# Patient Record
Sex: Female | Born: 1980 | Race: Black or African American | Hispanic: No | Marital: Single | Smoking: Never smoker
Health system: Southern US, Community
[De-identification: ages and names within clinical notes are randomized; demographics above are authoritative.]

## PROBLEM LIST (undated history)

## (undated) DIAGNOSIS — T7840XA Allergy, unspecified, initial encounter: Secondary | ICD-10-CM

## (undated) DIAGNOSIS — E049 Nontoxic goiter, unspecified: Secondary | ICD-10-CM

## (undated) DIAGNOSIS — D649 Anemia, unspecified: Secondary | ICD-10-CM

## (undated) DIAGNOSIS — K219 Gastro-esophageal reflux disease without esophagitis: Secondary | ICD-10-CM

## (undated) DIAGNOSIS — E669 Obesity, unspecified: Secondary | ICD-10-CM

## (undated) DIAGNOSIS — D689 Coagulation defect, unspecified: Secondary | ICD-10-CM

## (undated) DIAGNOSIS — Z5189 Encounter for other specified aftercare: Secondary | ICD-10-CM

## (undated) DIAGNOSIS — G43909 Migraine, unspecified, not intractable, without status migrainosus: Secondary | ICD-10-CM

## (undated) HISTORY — DX: Nontoxic goiter, unspecified: E04.9

## (undated) HISTORY — DX: Migraine, unspecified, not intractable, without status migrainosus: G43.909

## (undated) HISTORY — DX: Coagulation defect, unspecified: D68.9

## (undated) HISTORY — DX: Encounter for other specified aftercare: Z51.89

## (undated) HISTORY — DX: Allergy, unspecified, initial encounter: T78.40XA

## (undated) HISTORY — DX: Gastro-esophageal reflux disease without esophagitis: K21.9

## (undated) HISTORY — DX: Obesity, unspecified: E66.9

---

## 2002-01-06 HISTORY — PX: TONSILLECTOMY: SUR1361

## 2012-06-12 DIAGNOSIS — M5412 Radiculopathy, cervical region: Secondary | ICD-10-CM | POA: Insufficient documentation

## 2013-01-06 HISTORY — PX: MYOMECTOMY: SHX85

## 2013-12-05 ENCOUNTER — Encounter: Payer: Self-pay | Admitting: Neurology

## 2013-12-05 ENCOUNTER — Ambulatory Visit (INDEPENDENT_AMBULATORY_CARE_PROVIDER_SITE_OTHER): Payer: 59 | Admitting: Neurology

## 2013-12-05 VITALS — BP 134/95 | HR 88 | Ht 65.0 in | Wt 306.0 lb

## 2013-12-05 DIAGNOSIS — G4761 Periodic limb movement disorder: Secondary | ICD-10-CM

## 2013-12-05 DIAGNOSIS — R0683 Snoring: Secondary | ICD-10-CM

## 2013-12-05 DIAGNOSIS — R351 Nocturia: Secondary | ICD-10-CM

## 2013-12-05 DIAGNOSIS — R51 Headache: Secondary | ICD-10-CM

## 2013-12-05 DIAGNOSIS — E669 Obesity, unspecified: Secondary | ICD-10-CM

## 2013-12-05 DIAGNOSIS — R519 Headache, unspecified: Secondary | ICD-10-CM

## 2013-12-05 DIAGNOSIS — G2581 Restless legs syndrome: Secondary | ICD-10-CM

## 2013-12-05 NOTE — Patient Instructions (Signed)
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
## 2015-12-07 DIAGNOSIS — M549 Dorsalgia, unspecified: Secondary | ICD-10-CM | POA: Insufficient documentation

## 2015-12-07 DIAGNOSIS — E049 Nontoxic goiter, unspecified: Secondary | ICD-10-CM | POA: Insufficient documentation

## 2015-12-07 DIAGNOSIS — E040 Nontoxic diffuse goiter: Secondary | ICD-10-CM | POA: Insufficient documentation

## 2015-12-07 DIAGNOSIS — E0590 Thyrotoxicosis, unspecified without thyrotoxic crisis or storm: Secondary | ICD-10-CM | POA: Insufficient documentation

## 2015-12-10 LAB — HM PAP SMEAR: HM Pap smear: NEGATIVE

## 2015-12-11 ENCOUNTER — Other Ambulatory Visit: Payer: Self-pay | Admitting: Obstetrics and Gynecology

## 2015-12-11 DIAGNOSIS — Z1231 Encounter for screening mammogram for malignant neoplasm of breast: Secondary | ICD-10-CM

## 2016-01-22 ENCOUNTER — Ambulatory Visit: Payer: Self-pay

## 2016-01-22 ENCOUNTER — Ambulatory Visit
Admission: RE | Admit: 2016-01-22 | Discharge: 2016-01-22 | Disposition: A | Discharge status: Home / Self Care | Payer: Commercial Managed Care - PPO | Source: Ambulatory Visit | Attending: Obstetrics and Gynecology | Admitting: Obstetrics and Gynecology

## 2016-01-22 DIAGNOSIS — Z1231 Encounter for screening mammogram for malignant neoplasm of breast: Secondary | ICD-10-CM | POA: Insufficient documentation

## 2018-01-18 ENCOUNTER — Ambulatory Visit (INDEPENDENT_AMBULATORY_CARE_PROVIDER_SITE_OTHER): Payer: Commercial Managed Care - PPO | Admitting: Nurse Practitioner

## 2018-01-18 ENCOUNTER — Encounter: Payer: Self-pay | Admitting: Nurse Practitioner

## 2018-01-18 VITALS — BP 158/92 | HR 71 | Temp 99.0°F | Ht 65.0 in | Wt 305.4 lb

## 2018-01-18 DIAGNOSIS — Z23 Encounter for immunization: Secondary | ICD-10-CM | POA: Diagnosis not present

## 2018-01-18 DIAGNOSIS — D500 Iron deficiency anemia secondary to blood loss (chronic): Secondary | ICD-10-CM

## 2018-01-18 DIAGNOSIS — E0590 Thyrotoxicosis, unspecified without thyrotoxic crisis or storm: Secondary | ICD-10-CM

## 2018-01-18 DIAGNOSIS — E049 Nontoxic goiter, unspecified: Secondary | ICD-10-CM

## 2018-01-18 DIAGNOSIS — Z136 Encounter for screening for cardiovascular disorders: Secondary | ICD-10-CM | POA: Diagnosis not present

## 2018-01-18 DIAGNOSIS — E559 Vitamin D deficiency, unspecified: Secondary | ICD-10-CM | POA: Diagnosis not present

## 2018-01-18 DIAGNOSIS — Z114 Encounter for screening for human immunodeficiency virus [HIV]: Secondary | ICD-10-CM

## 2018-01-18 DIAGNOSIS — D259 Leiomyoma of uterus, unspecified: Secondary | ICD-10-CM | POA: Insufficient documentation

## 2018-01-18 DIAGNOSIS — Z0001 Encounter for general adult medical examination with abnormal findings: Secondary | ICD-10-CM | POA: Diagnosis not present

## 2018-01-18 DIAGNOSIS — G43919 Migraine, unspecified, intractable, without status migrainosus: Secondary | ICD-10-CM | POA: Insufficient documentation

## 2018-01-18 DIAGNOSIS — Z13220 Encounter for screening for lipoid disorders: Secondary | ICD-10-CM | POA: Diagnosis not present

## 2018-01-18 DIAGNOSIS — N920 Excessive and frequent menstruation with regular cycle: Secondary | ICD-10-CM

## 2018-01-18 LAB — TSH: TSH: 0.26 u[IU]/mL — ABNORMAL LOW (ref 0.35–4.50)

## 2018-01-18 LAB — COMPREHENSIVE METABOLIC PANEL WITH GFR
ALT: 13 U/L (ref 0–35)
AST: 15 U/L (ref 0–37)
Albumin: 4.2 g/dL (ref 3.5–5.2)
Alkaline Phosphatase: 43 U/L (ref 39–117)
BUN: 8 mg/dL (ref 6–23)
CO2: 23 meq/L (ref 19–32)
Calcium: 9.7 mg/dL (ref 8.4–10.5)
Chloride: 107 meq/L (ref 96–112)
Creatinine, Ser: 0.87 mg/dL (ref 0.40–1.20)
GFR: 93.29 mL/min
Glucose, Bld: 77 mg/dL (ref 70–99)
Potassium: 3.8 meq/L (ref 3.5–5.1)
Sodium: 138 meq/L (ref 135–145)
Total Bilirubin: 0.4 mg/dL (ref 0.2–1.2)
Total Protein: 7 g/dL (ref 6.0–8.3)

## 2018-01-18 LAB — IBC PANEL
Iron: 19 ug/dL — ABNORMAL LOW (ref 42–145)
Saturation Ratios: 4.7 % — ABNORMAL LOW (ref 20.0–50.0)
Transferrin: 290 mg/dL (ref 212.0–360.0)

## 2018-01-18 LAB — CBC WITH DIFFERENTIAL/PLATELET
Basophils Absolute: 0 10*3/uL (ref 0.0–0.1)
Basophils Relative: 1.1 % (ref 0.0–3.0)
Eosinophils Absolute: 0.1 10*3/uL (ref 0.0–0.7)
Eosinophils Relative: 2 % (ref 0.0–5.0)
HCT: 30.6 % — ABNORMAL LOW (ref 36.0–46.0)
Hemoglobin: 9.6 g/dL — ABNORMAL LOW (ref 12.0–15.0)
Lymphocytes Relative: 44.4 % (ref 12.0–46.0)
Lymphs Abs: 1.2 10*3/uL (ref 0.7–4.0)
MCHC: 31.4 g/dL (ref 30.0–36.0)
MCV: 63.2 fl — ABNORMAL LOW (ref 78.0–100.0)
Monocytes Absolute: 0.2 10*3/uL (ref 0.1–1.0)
Monocytes Relative: 8.5 % (ref 3.0–12.0)
Neutro Abs: 1.2 10*3/uL — ABNORMAL LOW (ref 1.4–7.7)
Neutrophils Relative %: 44 % (ref 43.0–77.0)
Platelets: 184 10*3/uL (ref 150.0–400.0)
RBC: 4.84 Mil/uL (ref 3.87–5.11)
RDW: 20.6 % — ABNORMAL HIGH (ref 11.5–15.5)
WBC: 2.8 10*3/uL — ABNORMAL LOW (ref 4.0–10.5)

## 2018-01-18 LAB — LIPID PANEL
Cholesterol: 173 mg/dL (ref 0–200)
HDL: 44.6 mg/dL
LDL Cholesterol: 107 mg/dL — ABNORMAL HIGH (ref 0–99)
NonHDL: 128.09
Total CHOL/HDL Ratio: 4
Triglycerides: 105 mg/dL (ref 0.0–149.0)
VLDL: 21 mg/dL (ref 0.0–40.0)

## 2018-01-18 LAB — T3, FREE: T3, Free: 3 pg/mL (ref 2.3–4.2)

## 2018-01-18 LAB — T4, FREE: FREE T4: 1.21 ng/dL (ref 0.60–1.60)

## 2018-01-18 NOTE — Progress Notes (Signed)
Subjective:    Patient ID: Brenda Mercado, female    DOB: [birth year], 38 y.o.   MRN: [id number]  Patient presents today for complete physical  HPI   Elevated BP: Does not check B at home Elevated noted since 2018 during OV with GYN and endocrinology.  Uterine firoids: Followed by GYN: Dr. Leonides Schanz, last appt 01/18/2018, pelvic US done today. Possible surgery vs IUD?   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 Labs done 08/2017 by endocrinology: T3 2.86, T4 1.0, TSH 0.740  Vitamin D deficiency: Last labs done 08/2017: Vit. D 20 Use of OTC supplement 5000IU once a day.  Depression/Suicide: Depression screen PHQ 2/9 01/18/2018  Decreased Interest 0  Down, Depressed, Hopeless 0  PHQ - 2 Score 0   Vision:done annually  Dental:done every 5months  Immunizations: (TDAP, Hep C screen, Pneumovax, Influenza, zoster)  Health Maintenance  Topic Date Due  . HIV Screening  03/12/1994  . Pap Smear  12/10/2018  . Tetanus Vaccine  01/19/2028  . Flu Shot  Completed   Diet:regular.  Weight:  Wt Readings from Last 3 Encounters:  01/18/18 (!) 305 lb 6.4 oz (138.5 kg)  12/05/13 (!) 306 lb (138.8 kg)   Exercise:walking 74mins daily   Fall Risk: Fall Risk  01/18/2018  Falls in the past year? 0   Medications and allergies reviewed with patient and updated if appropriate.  Patient Active Problem List   Diagnosis Date Noted  . Iron deficiency anemia due to chronic blood loss 01/19/2018  . Menorrhagia with regular cycle 01/19/2018  . Vitamin D deficiency 01/18/2018  . Goiter 01/18/2018  . Intractable migraine without status migrainosus 01/18/2018  . Uterine leiomyoma 01/18/2018  . Back pain 12/07/2015  . Subclinical hyperthyroidism 12/07/2015  . 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  cetirizine (ZYRTEC) 10 MG tablet Take 10 mg by mouth daily.    . fluticasone (FLONASE) 50 MCG/ACT nasal spray Place 1 spray into the nose as needed.    . naproxen (NAPROSYN) 500 MG tablet Take 500 mg by mouth as needed.    . SUMAtriptan (IMITREX) 25 MG tablet Take 1 tablet by mouth as needed.    . Vitamin D, Cholecalciferol, 25 MCG (1000 UT) TABS Take by mouth.     No current facility-administered medications on file prior to visit.     Past Medical History:  Diagnosis Date  . Goiter   . Migraines   . Migraines   . Obesity     Past Surgical History:  Procedure Laterality Date  . MYOMECTOMY  2015  . TONSILLECTOMY  2004    Social History   Socioeconomic History  . Marital status: Single    Spouse name: Not on file  . Number of children: 0  . Years of education: Masters  . Highest education level: Not on file  Occupational History  . Occupation: Interior and spatial designer: [employer]  . Financial resource strain: Not on file  . Food insecurity:    Worry: Not on file    Inability: Not on file  . Transportation needs:    Medical: Not on file    Non-medical: Not on file  Tobacco Use  . Smoking status: Never Smoker  . Smokeless tobacco: Never Used  Substance and Sexual Activity  . Alcohol use: Yes  Alcohol/week: 0.0 standard drinks    Comment: occasionally  . Drug use: No  . Sexual activity: Not on file  Lifestyle  . Physical activity:    Days per week: Not on file    Minutes per session: Not on file  . Stress: Not on file  Relationships  . Social connections:    Talks on phone: Not on file    Gets together: Not on file    Attends religious service: Not on file    Active member of club or organization: Not on file    Attends meetings of clubs or organizations: Not on file    Relationship status: Not on file  Other Topics Concern  . Not on file  Social History Narrative   Patient lives alone.   Patient is Right Handed.   Caffeine Use: 1 cup  daily   Patient has a Master's Degree.          Family History  Problem Relation Age of Onset  . Heart murmur Mother   . Heart Problems Father   . Hypertension Father   . Diabetes Maternal Grandmother   . Migraines Brother   . Migraines Sister   . Breast cancer Maternal Uncle        mat great uncle        Review of Systems  Constitutional: Negative for fever, malaise/fatigue and weight loss.  HENT: Negative for congestion and sore throat.   Eyes:       Negative for visual changes  Respiratory: Negative for cough and shortness of breath.   Cardiovascular: Negative for chest pain, palpitations and leg swelling.  Gastrointestinal: Negative for blood in stool, constipation, diarrhea and heartburn.  Genitourinary: Negative for dysuria, frequency and urgency.  Musculoskeletal: Positive for back pain. Negative for falls, joint pain and myalgias.  Skin: Negative for rash.  Neurological: Negative for dizziness, sensory change and headaches.  Endo/Heme/Allergies: Does not bruise/bleed easily.  Psychiatric/Behavioral: Negative for depression, substance abuse and suicidal ideas. The patient is not nervous/anxious.     Objective:   Vitals:   01/18/18 1313  BP: (!) 158/92  Pulse: 71  Temp: 99 F (37.2 C)  SpO2: 100%    Body mass index is 50.82 kg/m.   Physical Examination:  Physical Exam Vitals signs reviewed.  Constitutional:      General: She is not in acute distress.    Appearance: She is well-developed.  HENT:     Right Ear: Tympanic membrane, ear canal and external ear normal.     Left Ear: Tympanic membrane, ear canal and external ear normal.     Nose: Nose normal.     Mouth/Throat:     Pharynx: No oropharyngeal exudate.  Eyes:     Conjunctiva/sclera: Conjunctivae normal.     Pupils: Pupils are equal, round, and reactive to light.  Neck:     Musculoskeletal: Normal range of motion and neck supple.     Thyroid: Thyromegaly present. No thyroid tenderness.    Cardiovascular:     Rate and Rhythm: Normal rate and regular rhythm.     Pulses: Normal pulses.     Heart sounds: Normal heart sounds.  Pulmonary:     Effort: Pulmonary effort is normal. No respiratory distress.     Breath sounds: Normal breath sounds.  Chest:     Chest wall: No tenderness.     Comments: Breast exam deferred to GYN Abdominal:     General: Bowel sounds are normal.  Palpations: Abdomen is soft. There is mass.     Comments: Palpable uterine fibroid (suprapubic region)  Genitourinary:    Comments: Deferred to GYN Musculoskeletal: Normal range of motion.  Lymphadenopathy:     Cervical: No cervical adenopathy.  Neurological:     Mental Status: She is alert and oriented to person, place, and time.     Deep Tendon Reflexes: Reflexes are normal and symmetric.  Psychiatric:        Mood and Affect: Mood normal.        Behavior: Behavior normal.        Thought Content: Thought content normal.        Judgment: Judgment normal.     ASSESSMENT and PLAN:  Jackqueline was seen today for establish care.  Diagnoses and all orders for this visit:  Encounter for preventative adult health care exam with abnormal findings -     Comprehensive metabolic panel -     Lipid panel  Menorrhagia with regular cycle -     T4, free -     IBC panel -     CBC w/Diff -     ferrous sulfate 325 (65 FE) MG tablet; Take 1 tablet (325 mg total) by mouth 2 (two) times daily with a meal.  Encounter for screening for human immunodeficiency virus (HIV) -     HIV Antibody (routine testing w rflx)  Goiter -     T4, free -     TSH -     T3, free  Vitamin D deficiency -     Vitamin D 1,25 dihydroxy  Encounter for lipid screening for cardiovascular disease  Need for diphtheria-tetanus-pertussis (Tdap) vaccine -     Tdap vaccine greater than or equal to 7yo IM  Iron deficiency anemia due to chronic blood loss -     ferrous sulfate 325 (65 FE) MG tablet; Take 1 tablet (325 mg total) by  mouth 2 (two) times daily with a meal.  Subclinical hyperthyroidism    Intractable migraine without status migrainosus No headache in over 1year. Use of imitrex as needed  Iron deficiency anemia due to chronic blood loss Secondary to menorrhagia from uterine fibroids. CBC indicate iron deficient anemia, need to start ferrous sulfate 325mg  BID with food. New RX sent.  Vitamin D deficiency Pending vitamin D. Consider PTH if vitamin D persistent low.  Subclinical hyperthyroidism Followed by Dr. 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 Labs done 08/2017 by endocrinology: T3 2.86, T4 1.0, TSH 0.740  01/18/2018 labs: TSH 0.26. Free T3 3.0, Free T4 1.21 Asymptomatic       Problem List Items Addressed This Visit      Endocrine   Goiter   Relevant Orders   T4, free (Completed)   TSH (Completed)   T3, free (Completed)   Subclinical hyperthyroidism    Followed by Dr. 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 Labs done 08/2017 by endocrinology: T3 2.86, T4 1.0, TSH 0.740  01/18/2018 labs: TSH 0.26. Free T3 3.0, Free T4 1.21 Asymptomatic         Other   Iron deficiency anemia due to chronic blood loss    Secondary to menorrhagia from uterine fibroids. CBC indicate iron deficient anemia, need to start ferrous sulfate 325mg  BID with food. New RX sent.      Relevant Medications   ferrous sulfate 325 (65 FE) MG tablet   Menorrhagia with regular cycle  Relevant Medications   ferrous sulfate 325 (65 FE) MG tablet   Other Relevant Orders   T4, free (Completed)   IBC panel (Completed)   CBC w/Diff (Completed)   Vitamin D deficiency    Pending vitamin D. Consider PTH if vitamin D persistent low.      Relevant Orders   Vitamin D 1,25 dihydroxy    Other Visit Diagnoses    Encounter for preventative adult health care exam with abnormal findings    -  Primary   Relevant Orders    Comprehensive metabolic panel (Completed)   Lipid panel (Completed)   Encounter for screening for human immunodeficiency virus (HIV)       Relevant Orders   HIV Antibody (routine testing w rflx) (Completed)   Encounter for lipid screening for cardiovascular disease       Need for diphtheria-tetanus-pertussis (Tdap) vaccine       Relevant Orders   Tdap vaccine greater than or equal to 7yo IM (Completed)      Follow up: Return in about 3 months (around 04/19/2018) for anemia and goiter (repeat cbc, ferritin, and Thyroid function).  Wilfred Lacy, NP

## 2018-01-18 NOTE — Patient Instructions (Addendum)
Negative HIV, Normal lipid panel and CMP. Mild decrease in TSh, normal T3 and T4. CBC indicate iron deficient anemia, need to start ferrous sulfate '325mg'$  BID with food. New RX sent. Pending vitamin D. Consider PTH if vitamin D persistent low.  Check BP on Tuesday Thursday and FRIday. Call office with BP readings.  Maintain DASH diet and regular exercise.  Health Maintenance, Female Adopting a healthy lifestyle and getting preventive care can go a long way to promote health and wellness. Talk with your health care provider about what schedule of regular examinations is right for you. This is a good chance for you to check in with your provider about disease prevention and staying healthy. In between checkups, there are plenty of things you can do on your own. Experts have done a lot of research about which lifestyle changes and preventive measures are most likely to keep you healthy. Ask your health care provider for more information. Weight and diet Eat a healthy diet  Be sure to include plenty of vegetables, fruits, low-fat dairy products, and lean protein.  Do not eat a lot of foods high in solid fats, added sugars, or salt.  Get regular exercise. This is one of the most important things you can do for your health. ? Most adults should exercise for at least 150 minutes each week. The exercise should increase your heart rate and make you sweat (moderate-intensity exercise). ? Most adults should also do strengthening exercises at least twice a week. This is in addition to the moderate-intensity exercise. Maintain a healthy weight  Body mass index (BMI) is a measurement that can be used to identify possible weight problems. It estimates body fat based on height and weight. Your health care provider can help determine your BMI and help you achieve or maintain a healthy weight.  For females 78 years of age and older: ? A BMI below 18.5 is considered underweight. ? A BMI of 18.5 to 24.9 is  normal. ? A BMI of 25 to 29.9 is considered overweight. ? A BMI of 30 and above is considered obese. Watch levels of cholesterol and blood lipids  You should start having your blood tested for lipids and cholesterol at 39 years of age, then have this test every 5 years.  You may need to have your cholesterol levels checked more often if: ? Your lipid or cholesterol levels are high. ? You are older than 39 years of age. ? You are at high risk for heart disease. Cancer screening Lung Cancer  Lung cancer screening is recommended for adults 60-61 years old who are at high risk for lung cancer because of a history of smoking.  A yearly low-dose CT scan of the lungs is recommended for people who: ? Currently smoke. ? Have quit within the past 15 years. ? Have at least a 30-pack-year history of smoking. A pack year is smoking an average of one pack of cigarettes a day for 1 year.  Yearly screening should continue until it has been 15 years since you quit.  Yearly screening should stop if you develop a health problem that would prevent you from having lung cancer treatment. Breast Cancer  Practice breast self-awareness. This means understanding how your breasts normally appear and feel.  It also means doing regular breast self-exams. Let your health care provider know about any changes, no matter how small.  If you are in your 20s or 30s, you should have a clinical breast exam (CBE) by a health  care provider every 1-3 years as part of a regular health exam.  If you are 61 or older, have a CBE every year. Also consider having a breast X-ray (mammogram) every year.  If you have a family history of breast cancer, talk to your health care provider about genetic screening.  If you are at high risk for breast cancer, talk to your health care provider about having an MRI and a mammogram every year.  Breast cancer gene (BRCA) assessment is recommended for women who have family members with  BRCA-related cancers. BRCA-related cancers include: ? Breast. ? Ovarian. ? Tubal. ? Peritoneal cancers.  Results of the assessment will determine the need for genetic counseling and BRCA1 and BRCA2 testing. Cervical Cancer Your health care provider may recommend that you be screened regularly for cancer of the pelvic organs (ovaries, uterus, and vagina). This screening involves a pelvic examination, including checking for microscopic changes to the surface of your cervix (Pap test). You may be encouraged to have this screening done every 3 years, beginning at age 56.  For women ages 56-65, health care providers may recommend pelvic exams and Pap testing every 3 years, or they may recommend the Pap and pelvic exam, combined with testing for human papilloma virus (HPV), every 5 years. Some types of HPV increase your risk of cervical cancer. Testing for HPV may also be done on women of any age with unclear Pap test results.  Other health care providers may not recommend any screening for nonpregnant women who are considered low risk for pelvic cancer and who do not have symptoms. Ask your health care provider if a screening pelvic exam is right for you.  If you have had past treatment for cervical cancer or a condition that could lead to cancer, you need Pap tests and screening for cancer for at least 20 years after your treatment. If Pap tests have been discontinued, your risk factors (such as having a new sexual partner) need to be reassessed to determine if screening should resume. Some women have medical problems that increase the chance of getting cervical cancer. In these cases, your health care provider may recommend more frequent screening and Pap tests. Colorectal Cancer  This type of cancer can be detected and often prevented.  Routine colorectal cancer screening usually begins at 39 years of age and continues through 39 years of age.  Your health care provider may recommend screening at  an earlier age if you have risk factors for colon cancer.  Your health care provider may also recommend using home test kits to check for hidden blood in the stool.  A small camera at the end of a tube can be used to examine your colon directly (sigmoidoscopy or colonoscopy). This is done to check for the earliest forms of colorectal cancer.  Routine screening usually begins at age 72.  Direct examination of the colon should be repeated every 5-10 years through 39 years of age. However, you may need to be screened more often if early forms of precancerous polyps or small growths are found. Skin Cancer  Check your skin from head to toe regularly.  Tell your health care provider about any new moles or changes in moles, especially if there is a change in a mole's shape or color.  Also tell your health care provider if you have a mole that is larger than the size of a pencil eraser.  Always use sunscreen. Apply sunscreen liberally and repeatedly throughout the day.  Protect  yourself by wearing long sleeves, pants, a wide-brimmed hat, and sunglasses whenever you are outside. Heart disease, diabetes, and high blood pressure  High blood pressure causes heart disease and increases the risk of stroke. High blood pressure is more likely to develop in: ? People who have blood pressure in the high end of the normal range (130-139/85-89 mm Hg). ? People who are overweight or obese. ? People who are African American.  If you are 14-4 years of age, have your blood pressure checked every 3-5 years. If you are 70 years of age or older, have your blood pressure checked every year. You should have your blood pressure measured twice-once when you are at a hospital or clinic, and once when you are not at a hospital or clinic. Record the average of the two measurements. To check your blood pressure when you are not at a hospital or clinic, you can use: ? An automated blood pressure machine at a pharmacy. ? A  home blood pressure monitor.  If you are between 34 years and 54 years old, ask your health care provider if you should take aspirin to prevent strokes.  Have regular diabetes screenings. This involves taking a blood sample to check your fasting blood sugar level. ? If you are at a normal weight and have a low risk for diabetes, have this test once every three years after 39 years of age. ? If you are overweight and have a high risk for diabetes, consider being tested at a younger age or more often. Preventing infection Hepatitis B  If you have a higher risk for hepatitis B, you should be screened for this virus. You are considered at high risk for hepatitis B if: ? You were born in a country where hepatitis B is common. Ask your health care provider which countries are considered high risk. ? Your parents were born in a high-risk country, and you have not been immunized against hepatitis B (hepatitis B vaccine). ? You have HIV or AIDS. ? You use needles to inject street drugs. ? You live with someone who has hepatitis B. ? You have had sex with someone who has hepatitis B. ? You get hemodialysis treatment. ? You take certain medicines for conditions, including cancer, organ transplantation, and autoimmune conditions. Hepatitis C  Blood testing is recommended for: ? Everyone born from [birth year] through 1966. ? Anyone with known risk factors for hepatitis C. Sexually transmitted infections (STIs)  You should be screened for sexually transmitted infections (STIs) including gonorrhea and chlamydia if: ? You are sexually active and are younger than 39 years of age. ? You are older than 39 years of age and your health care provider tells you that you are at risk for this type of infection. ? Your sexual activity has changed since you were last screened and you are at an increased risk for chlamydia or gonorrhea. Ask your health care provider if you are at risk.  If you do not have HIV, but are  at risk, it may be recommended that you take a prescription medicine daily to prevent HIV infection. This is called pre-exposure prophylaxis (PrEP). You are considered at risk if: ? You are sexually active and do not regularly use condoms or know the HIV status of your partner(s). ? You take drugs by injection. ? You are sexually active with a partner who has HIV. Talk with your health care provider about whether you are at high risk of being infected with HIV. If  you choose to begin PrEP, you should first be tested for HIV. You should then be tested every 3 months for as long as you are taking PrEP. Pregnancy  If you are premenopausal and you may become pregnant, ask your health care provider about preconception counseling.  If you may become pregnant, take 400 to 800 micrograms (mcg) of folic acid every day.  If you want to prevent pregnancy, talk to your health care provider about birth control (contraception). Osteoporosis and menopause  Osteoporosis is a disease in which the bones lose minerals and strength with aging. This can result in serious bone fractures. Your risk for osteoporosis can be identified using a bone density scan.  If you are 45 years of age or older, or if you are at risk for osteoporosis and fractures, ask your health care provider if you should be screened.  Ask your health care provider whether you should take a calcium or vitamin D supplement to lower your risk for osteoporosis.  Menopause may have certain physical symptoms and risks.  Hormone replacement therapy may reduce some of these symptoms and risks. Talk to your health care provider about whether hormone replacement therapy is right for you. Follow these instructions at home:  Schedule regular health, dental, and eye exams.  Stay current with your immunizations.  Do not use any tobacco products including cigarettes, chewing tobacco, or electronic cigarettes.  If you are pregnant, do not drink  alcohol.  If you are breastfeeding, limit how much and how often you drink alcohol.  Limit alcohol intake to no more than 1 drink per day for nonpregnant women. One drink equals 12 ounces of beer, 5 ounces of wine, or 1 ounces of hard liquor.  Do not use street drugs.  Do not share needles.  Ask your health care provider for help if you need support or information about quitting drugs.  Tell your health care provider if you often feel depressed.  Tell your health care provider if you have ever been abused or do not feel safe at home. This information is not intended to replace advice given to you by your health care provider. Make sure you discuss any questions you have with your health care provider. Document Released: 07/08/2010 Document Revised: 05/31/2015 Document Reviewed: 09/26/2014 Elsevier Interactive Patient Education  2019 [employer].

## 2018-01-18 NOTE — Assessment & Plan Note (Addendum)
No headache in over 1year. Use of imitrex as needed

## 2018-01-19 ENCOUNTER — Encounter: Payer: Self-pay | Admitting: Nurse Practitioner

## 2018-01-19 DIAGNOSIS — D500 Iron deficiency anemia secondary to blood loss (chronic): Secondary | ICD-10-CM | POA: Insufficient documentation

## 2018-01-19 DIAGNOSIS — N920 Excessive and frequent menstruation with regular cycle: Secondary | ICD-10-CM | POA: Insufficient documentation

## 2018-01-19 MED ORDER — FERROUS SULFATE 325 (65 FE) MG PO TABS: 325.0000 mg | ORAL_TABLET | Freq: Two times a day (BID) | ORAL | 5 refills | Status: DC

## 2018-01-19 NOTE — Assessment & Plan Note (Signed)
Secondary to menorrhagia from uterine fibroids. CBC indicate iron deficient anemia, need to start ferrous sulfate 325mg  BID with food. New RX sent.

## 2018-01-19 NOTE — Assessment & Plan Note (Signed)
Followed by Dr. 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 Labs done 08/2017 by endocrinology: T3 2.86, T4 1.0, TSH 0.740  01/18/2018 labs: TSH 0.26. Free T3 3.0, Free T4 1.21 Asymptomatic

## 2018-01-19 NOTE — Assessment & Plan Note (Signed)
Pending vitamin D. Consider PTH if vitamin D persistent low.

## 2018-01-21 LAB — VITAMIN D 1,25 DIHYDROXY
Vitamin D 1, 25 (OH)2 Total: 97 pg/mL — ABNORMAL HIGH (ref 18–72)
Vitamin D2 1, 25 (OH)2: 9 pg/mL
Vitamin D3 1, 25 (OH)2: 88 pg/mL

## 2018-01-21 LAB — HIV ANTIBODY (ROUTINE TESTING W REFLEX): HIV 1&2 Ab, 4th Generation: NONREACTIVE

## 2018-04-19 ENCOUNTER — Ambulatory Visit: Payer: Commercial Managed Care - PPO | Admitting: Nurse Practitioner

## 2018-06-29 ENCOUNTER — Telehealth: Payer: Self-pay | Admitting: Nurse Practitioner

## 2018-06-29 NOTE — Telephone Encounter (Signed)
I called and left message on patient voicemail to call office and reschedule appointment for F2F or do a doxy.me for appointment on 07/26/2018.

## 2018-07-26 ENCOUNTER — Ambulatory Visit: Payer: Commercial Managed Care - PPO | Admitting: Nurse Practitioner

## 2018-08-23 ENCOUNTER — Other Ambulatory Visit: Payer: Self-pay | Admitting: Obstetrics & Gynecology

## 2018-08-23 DIAGNOSIS — D219 Benign neoplasm of connective and other soft tissue, unspecified: Secondary | ICD-10-CM

## 2018-08-28 ENCOUNTER — Encounter (HOSPITAL_BASED_OUTPATIENT_CLINIC_OR_DEPARTMENT_OTHER): Payer: Self-pay

## 2018-08-28 ENCOUNTER — Ambulatory Visit (HOSPITAL_BASED_OUTPATIENT_CLINIC_OR_DEPARTMENT_OTHER): Payer: Commercial Managed Care - PPO

## 2018-12-13 ENCOUNTER — Other Ambulatory Visit: Payer: Self-pay

## 2018-12-13 ENCOUNTER — Encounter
Admission: RE | Admit: 2018-12-13 | Discharge: 2018-12-13 | Disposition: A | Discharge status: Home / Self Care | Payer: Commercial Managed Care - PPO | Source: Ambulatory Visit | Attending: Obstetrics & Gynecology | Admitting: Obstetrics & Gynecology

## 2018-12-13 HISTORY — DX: Anemia, unspecified: D64.9

## 2018-12-13 NOTE — Patient Instructions (Signed)
Your procedure is scheduled on: Friday 12/11 Report to Day Surgery. To find out your arrival time please call [phone] between 1PM - 3PM on Thurs 12/10.  Remember: Instructions that are not followed completely may result in serious medical risk,  up to and including death, or upon the discretion of your surgeon and anesthesiologist your  surgery may need to be rescheduled.     _X__ 1. Do not eat food after midnight the night before your procedure.                 No gum chewing or hard candies. You may drink clear liquids up to 2 hours                 before you are scheduled to arrive for your surgery- DO not drink clear                 liquids within 2 hours of the start of your surgery.                 Clear Liquids include:  water, apple juice without pulp, clear carbohydrate                 drink such as Clearfast of Gatorade, Black Coffee or Tea (Do not add                 anything to coffee or tea).  __X__2.  On the morning of surgery brush your teeth with toothpaste and water, you                may rinse your mouth with mouthwash if you wish.  Do not swallow any toothpaste of mouthwash.     _X__ 3.  No Alcohol for 24 hours before or after surgery.   ___ 4.  Do Not Smoke or use e-cigarettes For 24 Hours Prior to Your Surgery.                 Do not use any chewable tobacco products for at least 6 hours prior to                 surgery.  ____  5.  Bring all medications with you on the day of surgery if instructed.   __x__  6.  Notify your doctor if there is any change in your medical condition      (cold, fever, infections).     Do not wear jewelry, make-up, hairpins, clips or nail polish. Do not wear lotions, powders, or perfumes. You may wear deodorant. Do not shave 48 hours prior to surgery. Men may shave face and neck. Do not bring valuables to the hospital.    [facility] is not responsible for any belongings or valuables.  Contacts,  dentures or bridgework may not be worn into surgery. Leave your suitcase in the car. After surgery it may be brought to your room. For patients admitted to the hospital, discharge time is determined by your treatment team.   Patients discharged the day of surgery will not be allowed to drive home.   Please read over the following fact sheets that you were given:     __x__ Take these medicines the morning of surgery with A SIP OF WATER:    1. none  2.   3.   4.  5.  6.  ____ Fleet Enema (as directed)   _x___ Use CHG Soap as directed  ____ Use inhalers on the day  of surgery  ____ Stop metformin 2 days prior to surgery    ____ Take 1/2 of usual insulin dose the night before surgery. No insulin the morning          of surgery.   ____ Stop Coumadin/Plavix/aspirin on   __x__ Stop Anti-inflammatories no ibuprofen aleve or aspirin until after surgery   ____ Stop supplements until after surgery.    ____ Bring C-Pap to the hospital.

## 2018-12-14 ENCOUNTER — Other Ambulatory Visit
Admission: RE | Admit: 2018-12-14 | Discharge: 2018-12-14 | Disposition: A | Discharge status: Home / Self Care | Payer: Commercial Managed Care - PPO | Source: Ambulatory Visit | Attending: Obstetrics & Gynecology | Admitting: Obstetrics & Gynecology

## 2018-12-14 DIAGNOSIS — Z01812 Encounter for preprocedural laboratory examination: Secondary | ICD-10-CM | POA: Insufficient documentation

## 2018-12-14 DIAGNOSIS — Z20828 Contact with and (suspected) exposure to other viral communicable diseases: Secondary | ICD-10-CM | POA: Insufficient documentation

## 2018-12-14 LAB — CBC
HCT: 36.9 % (ref 36.0–46.0)
Hemoglobin: 11.3 g/dL — ABNORMAL LOW (ref 12.0–15.0)
MCH: 21.3 pg — ABNORMAL LOW (ref 26.0–34.0)
MCHC: 30.6 g/dL (ref 30.0–36.0)
MCV: 69.5 fL — ABNORMAL LOW (ref 80.0–100.0)
Platelets: 199 10*3/uL (ref 150–400)
RBC: 5.31 MIL/uL — ABNORMAL HIGH (ref 3.87–5.11)
RDW: 17.5 % — ABNORMAL HIGH (ref 11.5–15.5)
WBC: 3.7 10*3/uL — ABNORMAL LOW (ref 4.0–10.5)
nRBC: 0 % (ref 0.0–0.2)

## 2018-12-14 LAB — BASIC METABOLIC PANEL
Anion gap: 9 (ref 5–15)
BUN: 11 mg/dL (ref 6–20)
CO2: 22 mmol/L (ref 22–32)
Calcium: 9.2 mg/dL (ref 8.9–10.3)
Chloride: 105 mmol/L (ref 98–111)
Creatinine, Ser: 0.85 mg/dL (ref 0.44–1.00)
GFR calc Af Amer: >60 mL/min (ref >60)
GFR calc non Af Amer: >60 mL/min (ref >60)
Glucose, Bld: 87 mg/dL (ref 70–99)
Potassium: 3.8 mmol/L (ref 3.5–5.1)
Sodium: 136 mmol/L (ref 135–145)

## 2018-12-15 LAB — SARS CORONAVIRUS 2 (TAT 6-24 HRS): SARS Coronavirus 2: NEGATIVE

## 2018-12-16 MED ORDER — DEXTROSE 5 % IV SOLN
3.0000 g | INTRAVENOUS | Status: AC
  Administered 2018-12-17 – 2018-12-18 (×4): 3 g via INTRAVENOUS
  Filled 2018-12-16: qty 3

## 2018-12-17 ENCOUNTER — Other Ambulatory Visit: Payer: Self-pay

## 2018-12-17 ENCOUNTER — Encounter
Admission: AD | Disposition: A | Discharge status: Home / Self Care | Payer: Self-pay | Source: Home / Self Care | Attending: Obstetrics & Gynecology

## 2018-12-17 ENCOUNTER — Inpatient Hospital Stay
Admission: AD | Admit: 2018-12-17 | Discharge: 2018-12-21 | DRG: 742 | Disposition: A | Discharge status: Home / Self Care | Payer: Commercial Managed Care - PPO | Attending: Obstetrics & Gynecology | Admitting: Obstetrics & Gynecology

## 2018-12-17 ENCOUNTER — Inpatient Hospital Stay: Payer: Commercial Managed Care - PPO | Admitting: Anesthesiology

## 2018-12-17 ENCOUNTER — Encounter: Payer: Self-pay | Admitting: Obstetrics & Gynecology

## 2018-12-17 DIAGNOSIS — Z20828 Contact with and (suspected) exposure to other viral communicable diseases: Secondary | ICD-10-CM | POA: Diagnosis present

## 2018-12-17 DIAGNOSIS — N920 Excessive and frequent menstruation with regular cycle: Secondary | ICD-10-CM | POA: Diagnosis present

## 2018-12-17 DIAGNOSIS — I10 Essential (primary) hypertension: Secondary | ICD-10-CM | POA: Diagnosis present

## 2018-12-17 DIAGNOSIS — D259 Leiomyoma of uterus, unspecified: Principal | ICD-10-CM | POA: Diagnosis present

## 2018-12-17 DIAGNOSIS — D62 Acute posthemorrhagic anemia: Secondary | ICD-10-CM | POA: Diagnosis present

## 2018-12-17 DIAGNOSIS — R0602 Shortness of breath: Secondary | ICD-10-CM | POA: Diagnosis not present

## 2018-12-17 DIAGNOSIS — I959 Hypotension, unspecified: Secondary | ICD-10-CM | POA: Diagnosis not present

## 2018-12-17 DIAGNOSIS — I871 Compression of vein: Secondary | ICD-10-CM | POA: Diagnosis not present

## 2018-12-17 HISTORY — PX: ROBOT ASSISTED MYOMECTOMY: SHX5142

## 2018-12-17 LAB — POCT PREGNANCY, URINE: Preg Test, Ur: NEGATIVE

## 2018-12-17 LAB — PREPARE RBC (CROSSMATCH)

## 2018-12-17 LAB — ABO/RH: ABO/RH(D): O POS

## 2018-12-17 SURGERY — MYOMECTOMY, ROBOT-ASSISTED
Anesthesia: General

## 2018-12-17 MED ORDER — SUCCINYLCHOLINE CHLORIDE 20 MG/ML IJ SOLN
INTRAMUSCULAR | Status: DC | PRN
  Administered 2018-12-17: 140 mg via INTRAVENOUS

## 2018-12-17 MED ORDER — GLYCOPYRROLATE 0.2 MG/ML IJ SOLN
INTRAMUSCULAR | Status: AC
  Filled 2018-12-17: qty 1

## 2018-12-17 MED ORDER — SCOPOLAMINE 1 MG/3DAYS TD PT72
1.0000 | MEDICATED_PATCH | TRANSDERMAL | Status: DC
  Administered 2018-12-17: 1.5 mg via TRANSDERMAL

## 2018-12-17 MED ORDER — DEXAMETHASONE SODIUM PHOSPHATE 10 MG/ML IJ SOLN: 4.0000 mg | INTRAMUSCULAR | Status: AC

## 2018-12-17 MED ORDER — DEXAMETHASONE SODIUM PHOSPHATE 10 MG/ML IJ SOLN
INTRAMUSCULAR | Status: AC
  Administered 2018-12-17: 13:00:00 4 mg via INTRAVENOUS
  Filled 2018-12-17: qty 1

## 2018-12-17 MED ORDER — GABAPENTIN 300 MG PO CAPS
ORAL_CAPSULE | ORAL | Status: AC
  Filled 2018-12-17: qty 2

## 2018-12-17 MED ORDER — SODIUM CHLORIDE 0.9 % IV SOLN
INTRAVENOUS | Status: DC | PRN
  Administered 2018-12-17 (×2): 50 ug via INTRAVENOUS

## 2018-12-17 MED ORDER — EPHEDRINE SULFATE 50 MG/ML IJ SOLN
INTRAMUSCULAR | Status: DC | PRN
  Administered 2018-12-17: 10 mg via INTRAVENOUS

## 2018-12-17 MED ORDER — ALBUMIN HUMAN 5 % IV SOLN
INTRAVENOUS | Status: AC
  Filled 2018-12-17: qty 500

## 2018-12-17 MED ORDER — FENTANYL CITRATE (PF) 100 MCG/2ML IJ SOLN
INTRAMUSCULAR | Status: AC
  Filled 2018-12-17: qty 2

## 2018-12-17 MED ORDER — SODIUM CHLORIDE (PF) 0.9 % IJ SOLN
INTRAMUSCULAR | Status: AC
  Filled 2018-12-17: qty 30

## 2018-12-17 MED ORDER — FAMOTIDINE 20 MG PO TABS
ORAL_TABLET | ORAL | Status: AC
  Filled 2018-12-17: qty 1

## 2018-12-17 MED ORDER — ENSURE PRE-SURGERY PO LIQD
592.0000 mL | Freq: Once | ORAL | Status: AC
  Administered 2018-12-17: 13:00:00 592 mL via ORAL
  Filled 2018-12-17: qty 592

## 2018-12-17 MED ORDER — VASOPRESSIN 20 UNIT/ML IV SOLN
INTRAVENOUS | Status: AC
  Filled 2018-12-17: qty 1

## 2018-12-17 MED ORDER — FENTANYL CITRATE (PF) 100 MCG/2ML IJ SOLN
INTRAMUSCULAR | Status: DC | PRN
  Administered 2018-12-17 – 2018-12-18 (×8): 50 ug via INTRAVENOUS

## 2018-12-17 MED ORDER — ALBUMIN HUMAN 5 % IV SOLN
INTRAVENOUS | Status: DC | PRN
  Administered 2018-12-17 (×2): via INTRAVENOUS

## 2018-12-17 MED ORDER — PHENYLEPHRINE HCL (PRESSORS) 10 MG/ML IV SOLN
INTRAVENOUS | Status: DC | PRN
  Administered 2018-12-17 (×3): 200 ug via INTRAVENOUS
  Administered 2018-12-17: 100 ug via INTRAVENOUS
  Administered 2018-12-17 (×2): 200 ug via INTRAVENOUS
  Administered 2018-12-17: 100 ug via INTRAVENOUS
  Administered 2018-12-17 (×2): 200 ug via INTRAVENOUS
  Administered 2018-12-17: 100 ug via INTRAVENOUS
  Administered 2018-12-17: 200 ug via INTRAVENOUS
  Administered 2018-12-17: 100 ug via INTRAVENOUS

## 2018-12-17 MED ORDER — BUPIVACAINE LIPOSOME 1.3 % IJ SUSP
INTRAMUSCULAR | Status: AC
  Filled 2018-12-17: qty 20

## 2018-12-17 MED ORDER — GLYCOPYRROLATE 0.2 MG/ML IJ SOLN
INTRAMUSCULAR | Status: DC | PRN
  Administered 2018-12-17: .2 mg via INTRAVENOUS

## 2018-12-17 MED ORDER — HEPARIN SODIUM (PORCINE) 5000 UNIT/ML IJ SOLN: 5000.0000 [IU] | INTRAMUSCULAR | Status: AC

## 2018-12-17 MED ORDER — GABAPENTIN 300 MG PO CAPS
600.0000 mg | ORAL_CAPSULE | ORAL | Status: AC
  Administered 2018-12-17: 600 mg via ORAL

## 2018-12-17 MED ORDER — DEXAMETHASONE SODIUM PHOSPHATE 10 MG/ML IJ SOLN
INTRAMUSCULAR | Status: DC | PRN
  Administered 2018-12-17 – 2018-12-18 (×2): 5 mg via INTRAVENOUS

## 2018-12-17 MED ORDER — ENSURE PRE-SURGERY PO LIQD
296.0000 mL | Freq: Once | ORAL | Status: DC
  Filled 2018-12-17: qty 296

## 2018-12-17 MED ORDER — ROCURONIUM BROMIDE 100 MG/10ML IV SOLN
INTRAVENOUS | Status: DC | PRN
  Administered 2018-12-17: 10 mg via INTRAVENOUS
  Administered 2018-12-17: 20 mg via INTRAVENOUS
  Administered 2018-12-17 (×2): 10 mg via INTRAVENOUS
  Administered 2018-12-17: 20 mg via INTRAVENOUS
  Administered 2018-12-17: 10 mg via INTRAVENOUS
  Administered 2018-12-17 (×4): 20 mg via INTRAVENOUS
  Administered 2018-12-17 (×3): 30 mg via INTRAVENOUS

## 2018-12-17 MED ORDER — BUPIVACAINE HCL (PF) 0.5 % IJ SOLN
INTRAMUSCULAR | Status: AC
  Filled 2018-12-17: qty 30

## 2018-12-17 MED ORDER — ONDANSETRON HCL 4 MG/2ML IJ SOLN
INTRAMUSCULAR | Status: DC | PRN
  Administered 2018-12-17: 4 mg via INTRAVENOUS

## 2018-12-17 MED ORDER — TRANEXAMIC ACID 1000 MG/10ML IV SOLN
INTRAVENOUS | Status: AC
  Filled 2018-12-17: qty 10

## 2018-12-17 MED ORDER — VASOPRESSIN 20 UNIT/ML IV SOLN
INTRAVENOUS | Status: DC | PRN
  Administered 2018-12-17: 10 [IU]
  Administered 2018-12-17: 20 [IU]

## 2018-12-17 MED ORDER — CELECOXIB 200 MG PO CAPS
ORAL_CAPSULE | ORAL | Status: AC
  Filled 2018-12-17: qty 2

## 2018-12-17 MED ORDER — PROPOFOL 10 MG/ML IV BOLUS
INTRAVENOUS | Status: DC | PRN
  Administered 2018-12-17: 200 mg via INTRAVENOUS

## 2018-12-17 MED ORDER — ROCURONIUM BROMIDE 50 MG/5ML IV SOLN
INTRAVENOUS | Status: AC
  Filled 2018-12-17: qty 1

## 2018-12-17 MED ORDER — PROPOFOL 10 MG/ML IV BOLUS
INTRAVENOUS | Status: AC
  Filled 2018-12-17: qty 20

## 2018-12-17 MED ORDER — ACETAMINOPHEN 500 MG PO TABS
1000.0000 mg | ORAL_TABLET | ORAL | Status: AC
  Administered 2018-12-17: 1000 mg via ORAL

## 2018-12-17 MED ORDER — LIDOCAINE HCL (PF) 2 % IJ SOLN
INTRAMUSCULAR | Status: AC
  Filled 2018-12-17: qty 10

## 2018-12-17 MED ORDER — VASOPRESSIN 20 UNIT/ML IV SOLN
INTRAVENOUS | Status: DC | PRN
  Administered 2018-12-17: 3 [IU] via INTRAVENOUS
  Administered 2018-12-17: 2 [IU] via INTRAVENOUS
  Administered 2018-12-17: 1 [IU] via INTRAVENOUS
  Administered 2018-12-17: 3 [IU] via INTRAVENOUS
  Administered 2018-12-17 (×3): 1 [IU] via INTRAVENOUS
  Administered 2018-12-17 (×2): 3 [IU] via INTRAVENOUS
  Administered 2018-12-17: 2 [IU] via INTRAVENOUS
  Administered 2018-12-17 (×2): 3 [IU] via INTRAVENOUS
  Administered 2018-12-17: 1 [IU] via INTRAVENOUS
  Administered 2018-12-17: 3 [IU] via INTRAVENOUS

## 2018-12-17 MED ORDER — HEPARIN SODIUM (PORCINE) 5000 UNIT/ML IJ SOLN
INTRAMUSCULAR | Status: AC
  Administered 2018-12-17: 5000 [IU] via SUBCUTANEOUS
  Filled 2018-12-17: qty 1

## 2018-12-17 MED ORDER — PHENYLEPHRINE HCL (PRESSORS) 10 MG/ML IV SOLN
INTRAVENOUS | Status: AC
  Filled 2018-12-17: qty 1

## 2018-12-17 MED ORDER — FAMOTIDINE 20 MG PO TABS
20.0000 mg | ORAL_TABLET | Freq: Once | ORAL | Status: AC
  Administered 2018-12-17: 12:00:00 20 mg via ORAL

## 2018-12-17 MED ORDER — TRANEXAMIC ACID-NACL 1000-0.7 MG/100ML-% IV SOLN
INTRAVENOUS | Status: DC | PRN
  Administered 2018-12-17 (×2): 1000 mg via INTRAVENOUS

## 2018-12-17 MED ORDER — MIDAZOLAM HCL 2 MG/2ML IJ SOLN
INTRAMUSCULAR | Status: DC | PRN
  Administered 2018-12-17 (×2): 1 mg via INTRAVENOUS

## 2018-12-17 MED ORDER — SODIUM CHLORIDE 0.9% IV SOLUTION: Freq: Once | INTRAVENOUS | Status: DC

## 2018-12-17 MED ORDER — SODIUM CHLORIDE (PF) 0.9 % IJ SOLN
INTRAMUSCULAR | Status: AC
  Filled 2018-12-17: qty 50

## 2018-12-17 MED ORDER — LACTATED RINGERS IV SOLN
INTRAVENOUS | Status: DC
  Administered 2018-12-17 (×2): via INTRAVENOUS

## 2018-12-17 MED ORDER — ACETAMINOPHEN 500 MG PO TABS
ORAL_TABLET | ORAL | Status: AC
  Administered 2018-12-18: 1000 mg via ORAL
  Filled 2018-12-17: qty 2

## 2018-12-17 MED ORDER — CELECOXIB 200 MG PO CAPS
400.0000 mg | ORAL_CAPSULE | ORAL | Status: AC
  Administered 2018-12-17: 400 mg via ORAL

## 2018-12-17 MED ORDER — MIDAZOLAM HCL 2 MG/2ML IJ SOLN
INTRAMUSCULAR | Status: AC
  Filled 2018-12-17: qty 2

## 2018-12-17 MED ORDER — ALBUMIN HUMAN 5 % IV SOLN
INTRAVENOUS | Status: AC
  Filled 2018-12-17: qty 250

## 2018-12-17 MED ORDER — LIDOCAINE HCL (CARDIAC) PF 100 MG/5ML IV SOSY
PREFILLED_SYRINGE | INTRAVENOUS | Status: DC | PRN
  Administered 2018-12-17: 100 mg via INTRAVENOUS

## 2018-12-17 MED ORDER — SCOPOLAMINE 1 MG/3DAYS TD PT72
MEDICATED_PATCH | TRANSDERMAL | Status: AC
  Filled 2018-12-17: qty 1

## 2018-12-17 SURGICAL SUPPLY — 116 items
ANCHOR TIS RET SYS 1550ML (BAG) ×3 IMPLANT
BAG URINE DRAIN 2000ML AR STRL (UROLOGICAL SUPPLIES) ×3 IMPLANT
BARRIER ADHS 3X4 INTERCEED (GAUZE/BANDAGES/DRESSINGS) ×3 IMPLANT
BLADE SURG SZ11 CARB STEEL (BLADE) ×15 IMPLANT
CANISTER SUCT 1200ML W/VALVE (MISCELLANEOUS) ×3 IMPLANT
CATH FOLEY 2WAY  5CC 16FR (CATHETERS) ×4
CATH URTH 16FR FL 2W BLN LF (CATHETERS) ×2 IMPLANT
CHLORAPREP W/TINT 26 (MISCELLANEOUS) ×9 IMPLANT
CLOSURE WOUND 1/2 X4 (GAUZE/BANDAGES/DRESSINGS) ×1
CORD BIP STRL DISP 12FT (MISCELLANEOUS) ×3 IMPLANT
COVER TIP SHEARS 8 DVNC (MISCELLANEOUS) ×1 IMPLANT
COVER TIP SHEARS 8MM DA VINCI (MISCELLANEOUS) ×2
COVER WAND RF STERILE (DRAPES) ×3 IMPLANT
CRADLE LAMINECT ARM (MISCELLANEOUS) ×3 IMPLANT
CUP MEDICINE 2OZ PLAST GRAD ST (MISCELLANEOUS) ×3 IMPLANT
DEFOGGER SCOPE WARMER CLEARIFY (MISCELLANEOUS) ×3 IMPLANT
DERMABOND ADVANCED (GAUZE/BANDAGES/DRESSINGS) ×4
DERMABOND ADVANCED .7 DNX12 (GAUZE/BANDAGES/DRESSINGS) ×2 IMPLANT
DRAPE 3/4 80X56 (DRAPES) ×3 IMPLANT
DRAPE ARM DVNC X/XI (DISPOSABLE) ×4 IMPLANT
DRAPE DA VINCI XI ARM (DISPOSABLE) ×8
DRAPE LAPAROTOMY 100X77 ABD (DRAPES) ×3 IMPLANT
DRAPE LAPAROTOMY TRNSV 106X77 (MISCELLANEOUS) IMPLANT
DRAPE LEGGINS SURG 28X43 STRL (DRAPES) ×3 IMPLANT
DRAPE UNDER BUTTOCK W/FLU (DRAPES) ×3 IMPLANT
DRIVER NDL 23- D MEGA 49.7X1.4 (INSTRUMENTS) IMPLANT
DRSG TELFA 3X8 NADH (GAUZE/BANDAGES/DRESSINGS) ×3 IMPLANT
DRVR NDL 23- D MEGA 49.7X1.4 (INSTRUMENTS)
ELECT BLADE 6 FLAT ULTRCLN (ELECTRODE) ×3 IMPLANT
ELECT CAUTERY BLADE 6.4 (BLADE) ×3 IMPLANT
ELECT REM PT RETURN 9FT ADLT (ELECTROSURGICAL) ×3
ELECTRODE REM PT RTRN 9FT ADLT (ELECTROSURGICAL) ×1 IMPLANT
FILTER LAP SMOKE EVAC STRL (MISCELLANEOUS) ×3 IMPLANT
GAUZE 4X4 16PLY RFD (DISPOSABLE) ×9 IMPLANT
GLOVE BIO SURGEON STRL SZ 6.5 (GLOVE) ×2 IMPLANT
GLOVE BIO SURGEON STRL SZ7 (GLOVE) ×3 IMPLANT
GLOVE BIO SURGEONS STRL SZ 6.5 (GLOVE) ×1
GLOVE BIOGEL PI IND STRL 6.5 (GLOVE) ×1 IMPLANT
GLOVE BIOGEL PI INDICATOR 6.5 (GLOVE) ×2
GLOVE INDICATOR 7.5 STRL GRN (GLOVE) ×3 IMPLANT
GLOVE PI ORTHOPRO 6.5 (GLOVE) ×2
GLOVE PI ORTHOPRO STRL 6.5 (GLOVE) ×1 IMPLANT
GLOVE SURG SYN 6.5 ES PF (GLOVE) ×24 IMPLANT
GOWN STRL REUS W/ TWL LRG LVL3 (GOWN DISPOSABLE) ×8 IMPLANT
GOWN STRL REUS W/ TWL XL LVL3 (GOWN DISPOSABLE) ×1 IMPLANT
GOWN STRL REUS W/TWL LRG LVL3 (GOWN DISPOSABLE) ×16
GOWN STRL REUS W/TWL XL LVL3 (GOWN DISPOSABLE) ×2
GRASPER SUT TROCAR 14GX15 (MISCELLANEOUS) ×3 IMPLANT
IRRIGATION STRYKERFLOW (MISCELLANEOUS) ×3 IMPLANT
IRRIGATOR STRYKERFLOW (MISCELLANEOUS) ×9
IV NS 1000ML (IV SOLUTION) ×2
IV NS 1000ML BAXH (IV SOLUTION) ×1 IMPLANT
KIT IMAGING PINPOINTPAQ (MISCELLANEOUS) IMPLANT
KIT PINK PAD W/HEAD ARE REST (MISCELLANEOUS) ×3
KIT PINK PAD W/HEAD ARM REST (MISCELLANEOUS) ×1 IMPLANT
KIT TURNOVER CYSTO (KITS) ×3 IMPLANT
LABEL OR SOLS (LABEL) ×3 IMPLANT
LIGASURE VESSEL 5MM BLUNT TIP (ELECTROSURGICAL) ×3 IMPLANT
MANIPULATOR VCARE LG CRV RETR (MISCELLANEOUS) IMPLANT
MANIPULATOR VCARE STD CRV RETR (MISCELLANEOUS) IMPLANT
NDL INSUFF 14G 150MM VS150000 (NEEDLE) IMPLANT
NDL SAFETY ECLIPSE 18X1.5 (NEEDLE) ×1 IMPLANT
NEEDLE HYPO 18GX1.5 SHARP (NEEDLE) ×2
NEEDLE HYPO 22GX1.5 SAFETY (NEEDLE) ×3 IMPLANT
NEEDLE SPNL 18GX3.5 QUINCKE PK (NEEDLE) ×3 IMPLANT
NEEDLE SPNL 20GX3.5 QUINCKE YW (NEEDLE) ×3 IMPLANT
NEEDLE SPNL 22GX5 LNG QUINC BK (NEEDLE) ×3 IMPLANT
NEEDLE VERESS 14GA 120MM (NEEDLE) ×3 IMPLANT
NS IRRIG 1000ML POUR BTL (IV SOLUTION) ×6 IMPLANT
OBTURATOR OPTICAL STANDARD 8MM (TROCAR) ×2
OBTURATOR OPTICAL STND 8 DVNC (TROCAR) ×1
OBTURATOR OPTICALSTD 8 DVNC (TROCAR) ×1 IMPLANT
PACK BASIN MAJOR ARMC (MISCELLANEOUS) ×3 IMPLANT
PACK LAP CHOLECYSTECTOMY (MISCELLANEOUS) ×3 IMPLANT
PAD OB MATERNITY 4.3X12.25 (PERSONAL CARE ITEMS) ×3 IMPLANT
PAD PREP 24X41 OB/GYN DISP (PERSONAL CARE ITEMS) ×3 IMPLANT
PENCIL ELECTRO HAND CTR (MISCELLANEOUS) ×3 IMPLANT
RETRACTOR RING XSMALL (MISCELLANEOUS) ×1 IMPLANT
RETRACTOR WOUND ALXS 18CM SML (MISCELLANEOUS) ×1 IMPLANT
RTRCTR WOUND ALEXIS 13CM XS SH (MISCELLANEOUS) ×3
RTRCTR WOUND ALEXIS O 18CM SML (MISCELLANEOUS) ×3
SEAL CANN UNIV 5-8 DVNC XI (MISCELLANEOUS) ×3 IMPLANT
SEAL XI 5MM-8MM UNIVERSAL (MISCELLANEOUS) ×6
SOL ANTI-FOG 6CC FOG-OUT (MISCELLANEOUS) ×1 IMPLANT
SOL FOG-OUT ANTI-FOG 6CC (MISCELLANEOUS) ×2
SOLUTION ELECTROLUBE (MISCELLANEOUS) ×3 IMPLANT
SPONGE LAP 18X18 RF (DISPOSABLE) IMPLANT
STAPLER SKIN PROX 35W (STAPLE) IMPLANT
STRIP CLOSURE SKIN 1/2X4 (GAUZE/BANDAGES/DRESSINGS) ×2 IMPLANT
SUT CUT NEEDLE DRIVER (INSTRUMENTS)
SUT DVC VLOC 180 0 12IN GS21 (SUTURE) ×36
SUT ETHIBOND CT1 BRD #0 30IN (SUTURE) IMPLANT
SUT ETHIBOND NAB CT1 #1 30IN (SUTURE) IMPLANT
SUT MNCRL 4-0 (SUTURE) ×2
SUT MNCRL 4-0 27XMFL (SUTURE) ×1
SUT VIC AB 0 CT1 27 (SUTURE)
SUT VIC AB 0 CT1 27XCR 8 STRN (SUTURE) IMPLANT
SUT VIC AB 0 CT1 36 (SUTURE) IMPLANT
SUT VIC AB 0 UR5 27 (SUTURE) IMPLANT
SUT VIC AB 1 CT1 36 (SUTURE) IMPLANT
SUT VIC AB 2-0 CT1 27 (SUTURE)
SUT VIC AB 2-0 CT1 TAPERPNT 27 (SUTURE) IMPLANT
SUT VIC AB 3-0 SH 27 (SUTURE)
SUT VIC AB 3-0 SH 27X BRD (SUTURE) IMPLANT
SUT VIC AB 4-0 PS2 18 (SUTURE) IMPLANT
SUT VICRYL 0 AB UR-6 (SUTURE) ×9 IMPLANT
SUTURE DVC VLC 180 0 12IN GS21 (SUTURE) ×12 IMPLANT
SUTURE MNCRL 4-0 27XMF (SUTURE) ×1 IMPLANT
SYR 10ML LL (SYRINGE) ×3 IMPLANT
SYR 30ML LL (SYRINGE) ×6 IMPLANT
SYR 3ML LL SCALE MARK (SYRINGE) ×3 IMPLANT
TRAY FOLEY MTR SLVR 16FR STAT (SET/KITS/TRAYS/PACK) IMPLANT
TROCAR BALLN GELPORT 12X130M (ENDOMECHANICALS) IMPLANT
TROCAR ENDO BLADELESS 11MM (ENDOMECHANICALS) ×3 IMPLANT
TROCAR XCEL NON-BLD 11X100MML (ENDOMECHANICALS) ×3 IMPLANT
TUBING EVAC SMOKE HEATED PNEUM (TUBING) IMPLANT

## 2018-12-17 NOTE — H&P (Signed)
Preoperative History and Physical  Brenda Mercado is a 38 y.o.  With gigantic fibroid uterus.   No significant preoperative concerns.  Proposed surgery:  robotic assisted myomectomy  Past Medical History:  Diagnosis Date  . Anemia   . Goiter   . Migraines   . Migraines   . Obesity    Past Surgical History:  Procedure Laterality Date  . MYOMECTOMY  2015  . TONSILLECTOMY  2004   OB History  No obstetric history on file.  Patient denies any other pertinent gynecologic issues.   No current facility-administered medications on file prior to encounter.   Current Outpatient Medications on File Prior to Encounter  Medication Sig Dispense Refill  . hydroxypropyl methylcellulose / hypromellose (ISOPTO TEARS / GONIOVISC) 2.5 % ophthalmic solution Place 1 drop into both eyes 3 (three) times daily as needed for dry eyes.    . cetirizine (ZYRTEC) 10 MG tablet Take 10 mg by mouth daily.    . fluticasone (FLONASE) 50 MCG/ACT nasal spray Place 1 spray into both nostrils daily as needed for allergies.     . SUMAtriptan (IMITREX) 25 MG tablet Take 25 mg by mouth every 2 (two) hours as needed for migraine.      No Known Allergies  Social History:   reports that she has never smoked. She has never used smokeless tobacco. She reports current alcohol use. She reports that she does not use drugs.  Family History  Problem Relation Age of Onset  . Heart murmur Mother   . Heart Problems Father   . Hypertension Father   . Diabetes Maternal Grandmother   . Migraines Brother   . Migraines Sister   . Breast cancer Maternal Uncle        mat great uncle    Review of Systems: Noncontributory  PHYSICAL EXAM: Blood pressure 135/87, pulse 88, temperature 98.7 F (37.1 C), temperature source Oral, resp. rate 16, height 5' 4.5" (1.638 m), weight 136.1 kg, last menstrual period 11/25/2018, SpO2 100 %. General appearance - alert, well appearing, and in no distress Chest - clear to auscultation, no  wheezes, rales or rhonchi, symmetric air entry Heart - normal rate and regular rhythm Abdomen - soft, nontender, nondistended, no masses or organomegaly Pelvic - examination not indicated Extremities - peripheral pulses normal, no pedal edema, no clubbing or cyanosis  Labs: Results for orders placed or performed during the hospital encounter of 12/17/18 (from the past 336 hour(s))  Pregnancy, urine POC   Collection Time: 12/17/18 12:49 PM  Result Value Ref Range   Preg Test, Ur NEGATIVE NEGATIVE  Results for orders placed or performed during the hospital encounter of 12/14/18 (from the past 336 hour(s))  SARS CORONAVIRUS 2 (TAT 6-24 HRS) Nasopharyngeal Nasopharyngeal Swab   Collection Time: 12/14/18 10:59 AM   Specimen: Nasopharyngeal Swab  Result Value Ref Range   SARS Coronavirus 2 NEGATIVE NEGATIVE  Basic metabolic panel   Collection Time: 12/14/18 10:59 AM  Result Value Ref Range   Sodium 136 135 - 145 mmol/L   Potassium 3.8 3.5 - 5.1 mmol/L   Chloride 105 98 - 111 mmol/L   CO2 22 22 - 32 mmol/L   Glucose, Bld 87 70 - 99 mg/dL   BUN 11 6 - 20 mg/dL   Creatinine, Ser 0.85 0.44 - 1.00 mg/dL   Calcium 9.2 8.9 - 10.3 mg/dL   GFR calc non Af Amer >60 >60 mL/min   GFR calc Af Amer >60 >60 mL/min   Anion gap  9 5 - 15  CBC   Collection Time: 12/14/18 10:59 AM  Result Value Ref Range   WBC 3.7 (L) 4.0 - 10.5 K/uL   RBC 5.31 (H) 3.87 - 5.11 MIL/uL   Hemoglobin 11.3 (L) 12.0 - 15.0 g/dL   HCT 36.9 36.0 - 46.0 %   MCV 69.5 (L) 80.0 - 100.0 fL   MCH 21.3 (L) 26.0 - 34.0 pg   MCHC 30.6 30.0 - 36.0 g/dL   RDW 17.5 (H) 11.5 - 15.5 %   Platelets 199 150 - 400 K/uL   nRBC 0.0 0.0 - 0.2 %  Type and screen [place]   Collection Time: 12/14/18 10:59 AM  Result Value Ref Range   ABO/RH(D) O POS    Antibody Screen NEG    Sample Expiration 12/28/2018,2359    Extend sample reason      NO TRANSFUSIONS OR PREGNANCY IN THE PAST 3 MONTHS Performed at [facility], [address] [place], [place] [address]     Imaging Studies: No results found.  Assessment: Patient Active Problem List   Diagnosis Date Noted  . Fibroid uterus 12/17/2018  . Iron deficiency anemia due to chronic blood loss 01/19/2018  . Menorrhagia with regular cycle 01/19/2018  . Vitamin D deficiency 01/18/2018  . Goiter 01/18/2018  . Intractable migraine without status migrainosus 01/18/2018  . Uterine leiomyoma 01/18/2018  . Back pain 12/07/2015  . Subclinical hyperthyroidism 12/07/2015  . 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   The risks of surgery were discussed in detail with the patient including but not limited to: bleeding which may require transfusion or reoperation; infection which may require antibiotics; injury to surrounding organs which may involve bowel, bladder, ureters ; need for additional procedures including laparoscopy or laparotomy; thromboembolic phenomenon, surgical site problems and other postoperative/anesthesia complications. Likelihood of success in alleviating the patient's condition was discussed. Routine postoperative instructions will be reviewed with the patient and her family in detail after surgery.  The patient concurred with the proposed plan, giving informed written consent for the surgery.  Patient has been NPO since last night she will remain NPO for procedure.  Anesthesia and OR aware.  Preoperative prophylactic antibiotics and SCDs ordered on call to the OR.  To OR when ready.  ----- Larey Days, MD, [place] Attending Obstetrician and Gynecologist [facility], Department of [facility]

## 2018-12-17 NOTE — Anesthesia Procedure Notes (Signed)
Procedure Name: Intubation Date/Time: 12/17/2018 1:25 PM Performed by: Allean Found, CRNA Pre-anesthesia Checklist: Patient identified, Patient being monitored, Timeout performed, Emergency Drugs available and Suction available Patient Re-evaluated:Patient Re-evaluated prior to induction Oxygen Delivery Method: Circle system utilized Preoxygenation: Pre-oxygenation with 100% oxygen Induction Type: IV induction Ventilation: Mask ventilation without difficulty Laryngoscope Size: 3 and McGraph Grade View: Grade I Tube type: Oral Tube size: 7.0 mm Number of attempts: 1 Airway Equipment and Method: Stylet Placement Confirmation: ETT inserted through vocal cords under direct vision,  positive ETCO2 and breath sounds checked- equal and bilateral Secured at: 21 cm Tube secured with: Tape Dental Injury: Teeth and Oropharynx as per pre-operative assessment

## 2018-12-17 NOTE — Anesthesia Post-op Follow-up Note (Signed)
Anesthesia QCDR form completed.        

## 2018-12-17 NOTE — Anesthesia Preprocedure Evaluation (Signed)
Anesthesia Evaluation  Patient identified by MRN, date of birth, ID band Patient awake    Reviewed: Allergy & Precautions, NPO status , Patient's Chart, lab work & pertinent test results  History of Anesthesia Complications Negative for: history of anesthetic complications  Airway Mallampati: II  TM Distance: >3 FB Neck ROM: Full    Dental no notable dental hx.    Pulmonary neg pulmonary ROS, neg sleep apnea, neg COPD,    breath sounds clear to auscultation- rhonchi (-) wheezing      Cardiovascular Exercise Tolerance: Good (-) hypertension(-) CAD, (-) Past MI, (-) Cardiac Stents and (-) CABG  Rhythm:Regular Rate:Normal - Systolic murmurs and - Diastolic murmurs    Neuro/Psych  Headaches, neg Seizures negative psych ROS   GI/Hepatic negative GI ROS, Neg liver ROS,   Endo/Other  neg diabetes  Renal/GU negative Renal ROS     Musculoskeletal negative musculoskeletal ROS (+)   Abdominal (+) + obese,   Peds  Hematology  (+) anemia ,   Anesthesia Other Findings Past Medical History: No date: Anemia No date: Goiter No date: Migraines No date: Migraines No date: Obesity   Reproductive/Obstetrics                             Anesthesia Physical Anesthesia Plan  ASA: II  Anesthesia Plan: General   Post-op Pain Management:    Induction: Intravenous  PONV Risk Score and Plan: 2 and Ondansetron, Dexamethasone and Midazolam  Airway Management Planned: Oral ETT  Additional Equipment:   Intra-op Plan:   Post-operative Plan: Extubation in OR  Informed Consent: I have reviewed the patients History and Physical, chart, labs and discussed the procedure including the risks, benefits and alternatives for the proposed anesthesia with the patient or authorized representative who has indicated his/her understanding and acceptance.     Dental advisory given  Plan Discussed with: CRNA and  Anesthesiologist  Anesthesia Plan Comments:         Anesthesia Quick Evaluation

## 2018-12-18 DIAGNOSIS — D62 Acute posthemorrhagic anemia: Secondary | ICD-10-CM | POA: Diagnosis present

## 2018-12-18 LAB — PREPARE RBC (CROSSMATCH)

## 2018-12-18 LAB — CBC
HCT: 34.8 % — ABNORMAL LOW (ref 36.0–46.0)
Hemoglobin: 12.8 g/dL (ref 12.0–15.0)
MCH: 27.9 pg (ref 26.0–34.0)
MCHC: 36.8 g/dL — ABNORMAL HIGH (ref 30.0–36.0)
MCV: 75.8 fL — ABNORMAL LOW (ref 80.0–100.0)
Platelets: 114 10*3/uL — ABNORMAL LOW (ref 150–400)
RBC: 4.59 MIL/uL (ref 3.87–5.11)
RDW: 17.5 % — ABNORMAL HIGH (ref 11.5–15.5)
WBC: 13.2 10*3/uL — ABNORMAL HIGH (ref 4.0–10.5)
nRBC: 0 % (ref 0.0–0.2)

## 2018-12-18 LAB — CBC WITH DIFFERENTIAL/PLATELET
Abs Immature Granulocytes: 0.06 10*3/uL (ref 0.00–0.07)
Basophils Absolute: 0 10*3/uL (ref 0.0–0.1)
Basophils Relative: 0 %
Eosinophils Absolute: 0 10*3/uL (ref 0.0–0.5)
Eosinophils Relative: 0 %
HCT: 14.3 % — CL (ref 36.0–46.0)
Hemoglobin: 4.4 g/dL — CL (ref 12.0–15.0)
Immature Granulocytes: 1 %
Lymphocytes Relative: 6 %
Lymphs Abs: 0.6 10*3/uL — ABNORMAL LOW (ref 0.7–4.0)
MCH: 26.5 pg (ref 26.0–34.0)
MCHC: 30.8 g/dL (ref 30.0–36.0)
MCV: 86.1 fL (ref 80.0–100.0)
Monocytes Absolute: 1.2 10*3/uL — ABNORMAL HIGH (ref 0.1–1.0)
Monocytes Relative: 12 %
Neutro Abs: 8 10*3/uL — ABNORMAL HIGH (ref 1.7–7.7)
Neutrophils Relative %: 81 %
Platelets: 59 10*3/uL — ABNORMAL LOW (ref 150–400)
RBC: 1.66 MIL/uL — ABNORMAL LOW (ref 3.87–5.11)
RDW: 18.9 % — ABNORMAL HIGH (ref 11.5–15.5)
WBC: 9.9 10*3/uL (ref 4.0–10.5)
nRBC: 0 % (ref 0.0–0.2)

## 2018-12-18 LAB — BPAM FFP
Blood Product Expiration Date: 202012162359
Blood Product Expiration Date: 202012162359
Blood Product Expiration Date: 202012162359
ISSUE DATE / TIME: 202012112007
ISSUE DATE / TIME: 202012112046
ISSUE DATE / TIME: 202012112323
Unit Type and Rh: 5100
Unit Type and Rh: 5100
Unit Type and Rh: 5100

## 2018-12-18 LAB — COMPREHENSIVE METABOLIC PANEL
ALT: 45 U/L — ABNORMAL HIGH (ref 0–44)
AST: 104 U/L — ABNORMAL HIGH (ref 15–41)
Albumin: 3.3 g/dL — ABNORMAL LOW (ref 3.5–5.0)
Alkaline Phosphatase: 35 U/L — ABNORMAL LOW (ref 38–126)
Anion gap: 10 (ref 5–15)
BUN: UNDETERMINED mg/dL (ref 6–20)
CO2: 17 mmol/L — ABNORMAL LOW (ref 22–32)
Calcium: 7.9 mg/dL — ABNORMAL LOW (ref 8.9–10.3)
Chloride: 113 mmol/L — ABNORMAL HIGH (ref 98–111)
Creatinine, Ser: <0.3 mg/dL — ABNORMAL LOW (ref 0.44–1.00)
Glucose, Bld: 133 mg/dL — ABNORMAL HIGH (ref 70–99)
Potassium: 4.4 mmol/L (ref 3.5–5.1)
Sodium: 140 mmol/L (ref 135–145)
Total Bilirubin: 1.3 mg/dL — ABNORMAL HIGH (ref 0.3–1.2)
Total Protein: 5.8 g/dL — ABNORMAL LOW (ref 6.5–8.1)

## 2018-12-18 LAB — PREPARE FRESH FROZEN PLASMA: Unit division: 0

## 2018-12-18 LAB — PROTIME-INR
INR: 1.2 (ref 0.8–1.2)
Prothrombin Time: 15.1 seconds (ref 11.4–15.2)

## 2018-12-18 LAB — GLUCOSE, CAPILLARY: Glucose-Capillary: 117 mg/dL — ABNORMAL HIGH (ref 70–99)

## 2018-12-18 LAB — MRSA PCR SCREENING: MRSA by PCR: NEGATIVE

## 2018-12-18 LAB — APTT: aPTT: 32 seconds (ref 24–36)

## 2018-12-18 LAB — FIBRINOGEN: Fibrinogen: 235 mg/dL (ref 210–475)

## 2018-12-18 LAB — MASSIVE TRANSFUSION PROTOCOL ORDER (BLOOD BANK NOTIFICATION)

## 2018-12-18 MED ORDER — HYDRALAZINE HCL 20 MG/ML IJ SOLN
5.0000 mg | INTRAMUSCULAR | Status: DC | PRN
  Administered 2018-12-18 (×2): 5 mg via INTRAVENOUS
  Filled 2018-12-18 (×2): qty 1

## 2018-12-18 MED ORDER — HYDROMORPHONE HCL 2 MG PO TABS
2.0000 mg | ORAL_TABLET | ORAL | Status: DC | PRN
  Administered 2018-12-18 – 2018-12-19 (×3): 2 mg via ORAL
  Filled 2018-12-18 (×4): qty 1

## 2018-12-18 MED ORDER — "VISTASEAL 4 ML SINGLE DOSE KIT "
PACK | CUTANEOUS | Status: DC | PRN
  Administered 2018-12-18: 4 mL via TOPICAL

## 2018-12-18 MED ORDER — BUPIVACAINE LIPOSOME 1.3 % IJ SUSP
INTRAMUSCULAR | Status: DC | PRN
  Administered 2018-12-18: 20 mL

## 2018-12-18 MED ORDER — OXYCODONE HCL 5 MG PO TABS
5.0000 mg | ORAL_TABLET | ORAL | Status: DC | PRN
  Administered 2018-12-18 – 2018-12-19 (×2): 10 mg via ORAL
  Administered 2018-12-20 (×3): 5 mg via ORAL
  Administered 2018-12-21: 10 mg via ORAL
  Administered 2018-12-21: 12:00:00 5 mg via ORAL
  Filled 2018-12-18: qty 1
  Filled 2018-12-18 (×3): qty 2
  Filled 2018-12-18 (×3): qty 1

## 2018-12-18 MED ORDER — SIMETHICONE 80 MG PO CHEW
160.0000 mg | CHEWABLE_TABLET | Freq: Four times a day (QID) | ORAL | Status: DC | PRN
  Administered 2018-12-18 – 2018-12-21 (×9): 160 mg via ORAL
  Filled 2018-12-18 (×10): qty 2

## 2018-12-18 MED ORDER — IBUPROFEN 800 MG PO TABS
800.0000 mg | ORAL_TABLET | Freq: Four times a day (QID) | ORAL | Status: DC
  Administered 2018-12-18 – 2018-12-21 (×11): 800 mg via ORAL
  Filled 2018-12-18 (×12): qty 1

## 2018-12-18 MED ORDER — PIPERACILLIN-TAZOBACTAM 3.375 G IVPB
3.3750 g | Freq: Once | INTRAVENOUS | Status: AC
  Administered 2018-12-18: 3.375 g via INTRAVENOUS
  Filled 2018-12-18: qty 50

## 2018-12-18 MED ORDER — IBUPROFEN 800 MG PO TABS
800.0000 mg | ORAL_TABLET | Freq: Three times a day (TID) | ORAL | Status: DC
  Administered 2018-12-18: 800 mg via ORAL
  Filled 2018-12-18: qty 1

## 2018-12-18 MED ORDER — MENTHOL 3 MG MT LOZG
1.0000 | LOZENGE | OROMUCOSAL | Status: DC | PRN
  Filled 2018-12-18: qty 9

## 2018-12-18 MED ORDER — ACETAMINOPHEN 500 MG PO TABS
1000.0000 mg | ORAL_TABLET | Freq: Four times a day (QID) | ORAL | Status: DC
  Administered 2018-12-18 – 2018-12-21 (×11): 1000 mg via ORAL
  Filled 2018-12-18 (×13): qty 2

## 2018-12-18 MED ORDER — CEFAZOLIN SODIUM 1 G IJ SOLR
INTRAMUSCULAR | Status: AC
  Filled 2018-12-18: qty 20

## 2018-12-18 MED ORDER — CHLORHEXIDINE GLUCONATE CLOTH 2 % EX PADS: 6.0000 | MEDICATED_PAD | Freq: Every day | CUTANEOUS | Status: DC

## 2018-12-18 MED ORDER — GABAPENTIN 300 MG PO CAPS
300.0000 mg | ORAL_CAPSULE | Freq: Every day | ORAL | Status: DC
  Administered 2018-12-18 – 2018-12-20 (×3): 300 mg via ORAL
  Filled 2018-12-18 (×3): qty 1

## 2018-12-18 MED ORDER — ONDANSETRON HCL 4 MG/2ML IJ SOLN: 4.0000 mg | Freq: Four times a day (QID) | INTRAMUSCULAR | Status: DC | PRN

## 2018-12-18 MED ORDER — FENTANYL CITRATE (PF) 100 MCG/2ML IJ SOLN
INTRAMUSCULAR | Status: AC
  Administered 2018-12-18: 50 ug via INTRAVENOUS
  Filled 2018-12-18: qty 2

## 2018-12-18 MED ORDER — SUGAMMADEX SODIUM 200 MG/2ML IV SOLN
INTRAVENOUS | Status: DC | PRN
  Administered 2018-12-18: 200 mg via INTRAVENOUS

## 2018-12-18 MED ORDER — SENNOSIDES-DOCUSATE SODIUM 8.6-50 MG PO TABS
1.0000 | ORAL_TABLET | Freq: Every day | ORAL | Status: DC
  Administered 2018-12-18 – 2018-12-20 (×3): 1 via ORAL
  Filled 2018-12-18 (×3): qty 1

## 2018-12-18 MED ORDER — FENTANYL CITRATE (PF) 100 MCG/2ML IJ SOLN
INTRAMUSCULAR | Status: AC
  Filled 2018-12-18: qty 2

## 2018-12-18 MED ORDER — TRIAMCINOLONE ACETONIDE 40 MG/ML IJ SUSP
INTRAMUSCULAR | Status: DC | PRN
  Administered 2018-12-18: 40 mg via INTRAMUSCULAR

## 2018-12-18 MED ORDER — ONDANSETRON HCL 4 MG PO TABS: 4.0000 mg | ORAL_TABLET | Freq: Four times a day (QID) | ORAL | Status: DC | PRN

## 2018-12-18 MED ORDER — LACTATED RINGERS IV SOLN
INTRAVENOUS | Status: DC
  Administered 2018-12-18: 75 mL/h via INTRAVENOUS
  Administered 2018-12-19 (×2): via INTRAVENOUS

## 2018-12-18 MED ORDER — TRIAMCINOLONE ACETONIDE 40 MG/ML IJ SUSP
INTRAMUSCULAR | Status: AC
  Filled 2018-12-18: qty 1

## 2018-12-18 MED ORDER — FUROSEMIDE 10 MG/ML IJ SOLN
40.0000 mg | Freq: Once | INTRAMUSCULAR | Status: AC
  Administered 2018-12-18: 19:00:00 40 mg via INTRAVENOUS
  Filled 2018-12-18: qty 4

## 2018-12-18 MED ORDER — HYDROMORPHONE HCL 1 MG/ML IJ SOLN
0.5000 mg | Freq: Once | INTRAMUSCULAR | Status: AC
  Administered 2018-12-18: 0.5 mg via INTRAVENOUS
  Filled 2018-12-18: qty 1

## 2018-12-18 MED ORDER — FENTANYL CITRATE (PF) 100 MCG/2ML IJ SOLN: 50.0000 ug | INTRAMUSCULAR | Status: DC | PRN

## 2018-12-18 MED ORDER — FENTANYL CITRATE (PF) 100 MCG/2ML IJ SOLN
50.0000 ug | INTRAMUSCULAR | Status: DC | PRN
  Administered 2018-12-18 (×5): 50 ug via INTRAVENOUS
  Filled 2018-12-18 (×3): qty 2

## 2018-12-18 MED ORDER — FUROSEMIDE 10 MG/ML IJ SOLN
20.0000 mg | Freq: Once | INTRAMUSCULAR | Status: AC
  Administered 2018-12-18: 03:00:00 20 mg via INTRAVENOUS
  Filled 2018-12-18: qty 2

## 2018-12-18 NOTE — Transfer of Care (Signed)
Immediate Anesthesia Transfer of Care Note  Patient: Brenda Mercado  Procedure(s) Performed: XI ROBOTIC ASSISTED MYOMECTOMY, WITH HAND MORCELLATION (N/A ) POSSIBLE LAPAROTOMY (N/A )  Patient Location: ICU  Anesthesia Type:General  Level of Consciousness: awake, alert  and oriented  Airway & Oxygen Therapy: Patient Spontanous Breathing and Patient connected to face mask oxygen  Post-op Assessment: Report given to RN and Post -op Vital signs reviewed and stable  Post vital signs: Reviewed and stable  Last Vitals:  Vitals Value Taken Time  BP    Temp    Pulse    Resp    SpO2      Last Pain:  Vitals:   12/17/18 1200  TempSrc: Oral  PainSc: 0-No pain         Complications: No apparent anesthesia complications

## 2018-12-18 NOTE — Progress Notes (Signed)
eLink Physician-Brief Progress Note Patient Name: Brenda Mercado DOB: 12-27-1980 MRN: [id number]   Date of Service  12/18/2018  HPI/Events of Note  47 F underwent robotic assisted myomectomy of uterine fibroid with EBL of 5500 ml and was transfused 8 PRBC, 3 platelets and 1 FFP. She was somewhat lethargic post op and is transferred to the ICU for closer monitoring.  eICU Interventions  Patient seen asleep but reportedly arousable and coherent Repeat labs pending, may need calcium Continue to monitor for signs of bleed        Judd Lien 12/18/2018, 4:10 AM

## 2018-12-18 NOTE — Op Note (Signed)
12/17/2018 - 12/18/2018  PATIENT:  Brenda Mercado  38 y.o. female  PRE-OPERATIVE DIAGNOSIS:  Fibroid Uterus  POST-OPERATIVE DIAGNOSIS:  Fibroid Uterus  PROCEDURE:  Procedure(s): XI ROBOTIC ASSISTED MYOMECTOMY, WITH HAND MORCELLATION (N/A) POSSIBLE LAPAROTOMY (N/A)  SURGEON:  Surgeon(s) and Role:    * Roselin Wiemann, Honor Loh, MD - Primary    * Benjaman Kindler, MD - Assisting  ANESTHESIA: GET  EBL:  Total I/O In: [id number] [I.V.:2000; Blood:3073; IV Piggyback:250] Out: 2300 [Urine:400; Blood:1900]  EBL 5500cc   DRAINS: foley to gravity   SPECIMEN: multiple fibroids  DISPOSITION OF SPECIMEN:  To pathology  COUNTS: correct x2  COMPLICATIONS: none apparent  PATIENT DISPOSITION:  VS stable to PACU    Indication for surgery: Patient had presented with complaints of massive fibroid uterus, and desire for future childbearing.  Various treatment options were discussed and patient requested a minimally invasive myomectomy. She had previously had a robotic assisted laparoscopic myomectomy. Reveiw of her MRI identified a stalk that appeared to be able to be resected safely, thus a robotic approach was preferred.  Risks benefits and alternatives were reviewed and informed consent was obtained.   Procedure: The patient was brought to the OR and identified as Brenda Mercado.  She was given general anesthesia via endotracheal route.  Nasogastric tube was placed.  She was then positioned in the dorsal lithotomy position and prepped and draped in the usual sterile fashion.  A surgical time-out was called. A foley catheter was placed.   After a change of gloves, the attention was turned to the abdomen.  A 14mm palmer's point Incision was made and veress needle inserted and tested for position which was confirmed.  Pneumoperitoneum was created to 25mmHg.  A 55mm trochar was inserted and evaluation of the uterus and fundal fibroid were performed.  The thin stalk that was visualized on MRI was much thicker, but  still fundal.  The decision was made to attempt robotic approach.  Three Robotic trochars and one 74mm assist trochar were inserted atraumatically under visualization.  The patient was placed in steep trendelenburg, and the daVinci robot was docked and monopolar scissors, bipolar fenestrated and, and grasper were employed.  A brief survey of the upper abdomen was performed.  The attention was turned to the pelvis.  The left and right ovaries and tubes were identified and appeared normal.  The bilateral cornua were also identified, and a natural curve marked the transition of the  fundus to the collection of fibroids.    This area was injected with pitressin in order to decrease blood flow during the dissection.  Several injections made, 30cc instilled.  A blanching effect was observed, and the monopolar was used to divide the serosa and myometrium.  Doing this exposed several fundal fibroids not apparent on the MRI.  There were many of small to medium size, and dozens more that were "seeds", <1cm in size.  16ml pitressin was again injected into the posterior half of the incision.  The fundal fibroids that were on the uterine side of this incision were shelled out of the myometrium. The endometrium was entered during this process.   The cornua were preserved, and once the majority of the myomas were removed, the beds were closed with v-lock suture, and the remaining muscle was imbricated layer after layer until the serosa was reached.  The serosa was reinforced was mattress sutures and then closed with a baseball stitch.   The robot was undocked and patient taken out of steep trendelenburg.  A 3cm skin incision was made suprapubically.  An endocatch bag was inserted and the smaller, individual fibroids were placed into the bag and retrieved from the abdomen.  The large fibroid complex was grasped and brought to the incision.  Using the ExCITE technique, the remaining myomatous complex was removed.  The fascia  of this incision was closed with running stitches from each apex.   The pneumoperitoneum was recreated, and the camera reinserted.  A scan of the pelvis, upper abdomen and in-between was performed, removing any stray fibroid fragments.  The abdominal cavity was irrigated copiously and evacuated of blood, clots, and any remaining fibroid fragments.  Hemostasis of the uterus was confirmed, and intercede was placed over the incision sites.  The laparoscopic instruments were removed, and the fascia of the 81mm trochar site was closed using the inlet closure device.  The remaining trochars were removed and the midline fascia was closed using 0-Vicryl.  The skin of all incision were closed with 4-0 monocryl and covered with surgical glue.  The procedure was then deemed complete.    The sponge, needle, and instrument counts were correct x2.  The patient tolerated reversal of anesthesia, and was brought to the ICU extubated, with stable vital signs, but in a guarded condition.  She was given 8 units of packed cells, 4 units of FFP, and 1 unit of platelets.  The massive blood loss was primarily from the venous pooling within the uterus and around the fibroids.     I was present and performed this case in its entirety. This case took 12 hours to complete.  A modifier for complexity and time were added to the CPT code.   Larey Days, MD Attending Obstetrician and Gynecologist [facility]

## 2018-12-18 NOTE — Progress Notes (Addendum)
Patient hemodynamically stable  No pressors needed overnight Results this morning: Hb 12.8, pLt 114 WBC 13.2   Ca2+ 7.9  Pain control poor with only fentanyl; orders for toradol, tylenol, gabapentin discontinued overnight for unknown reason.  Restarted.   Transferring out of ICU to surgical floor To get OOB today, catch up on pain control Hopefully d/c foley and work towards discharge. Am re-running the OR CBC just to see if that was a true value.    See labs below.  BP 138/76 (BP Location: Left Arm)   Pulse 79   Temp 99.1 F (37.3 C) (Oral)   Resp 16   Ht 5\' 4"  (1.626 m)   Wt (!) 141.4 kg   LMP 11/25/2018   SpO2 98%   BMI 53.51 kg/m     Results for orders placed or performed during the hospital encounter of 12/17/18 (from the past 24 hour(s))  Prepare RBC (crossmatch)     Status: None   Collection Time: 12/17/18  5:37 PM  Result Value Ref Range   Order Confirmation      ORDER PROCESSED BY BLOOD BANK Performed at [facility], [address], [place], [place] [address]   Prepare RBC (crossmatch)     Status: None   Collection Time: 12/17/18  6:46 PM  Result Value Ref Range   Order Confirmation      ORDER PROCESSED BY BLOOD BANK Performed at [facility], [address], [place], [place] [address]   Prepare fresh frozen plasma     Status: None   Collection Time: 12/17/18  7:29 PM  Result Value Ref Range   Unit Number [id number]    Blood Component Type THW PLS APHR    Unit division 00    Status of Unit ISSUED,FINAL    Transfusion Status OK TO TRANSFUSE    Unit Number [id number]    Blood Component Type THW PLS APHR    Unit division A0    Status of Unit ISSUED,FINAL    Transfusion Status      OK TO TRANSFUSE Performed at [facility], [address] [place], [place] [address]    Unit Number [id number]    Blood Component Type THW PLS APHR    Unit division B0    Status of Unit ISSUED,FINAL    Transfusion Status OK TO  TRANSFUSE   Prepare RBC (crossmatch)     Status: None   Collection Time: 12/17/18  9:15 PM  Result Value Ref Range   Order Confirmation      ORDER PROCESSED BY BLOOD BANK Performed at [facility], [address] [place], [place] [address]   Prepare Pheresed Platelets     Status: None (Preliminary result)   Collection Time: 12/17/18  9:16 PM  Result Value Ref Range   Unit Number [id number]    Blood Component Type PLTP LR2 PAS    Unit division 00    Status of Unit ISSUED    Transfusion Status OK TO TRANSFUSE    Unit Number [id number]    Blood Component Type PLTP LR1 PAS    Unit division 00    Status of Unit REL FROM [facility]    Transfusion Status      OK TO TRANSFUSE Performed at [facility], [address] [place], [place] [address]   Initiate MTP (Blood Bank Notification)     Status: None   Collection Time: 12/17/18  9:33 PM  Result Value Ref Range   Initiate Massive  Transfusion Protocol      MTP ORDER RECEIVED DISCONTINUED 12/18/2018 0732 PER IXL Performed at [facility], [address] [place], [place] [address]   CBC with Differential/Platelet     Status: Abnormal   Collection Time: 12/17/18 11:53 PM  Result Value Ref Range   WBC 9.9 4.0 - 10.5 K/uL   RBC 1.66 (L) 3.87 - 5.11 MIL/uL   Hemoglobin 4.4 (LL) 12.0 - 15.0 g/dL   HCT 14.3 (LL) 36.0 - 46.0 %   MCV 86.1 80.0 - 100.0 fL   MCH 26.5 26.0 - 34.0 pg   MCHC 30.8 30.0 - 36.0 g/dL   RDW 18.9 (H) 11.5 - 15.5 %   Platelets 59 (L) 150 - 400 K/uL   nRBC 0.0 0.0 - 0.2 %   Neutrophils Relative % 81 %   Neutro Abs 8.0 (H) 1.7 - 7.7 K/uL   Lymphocytes Relative 6 %   Lymphs Abs 0.6 (L) 0.7 - 4.0 K/uL   Monocytes Relative 12 %   Monocytes Absolute 1.2 (H) 0.1 - 1.0 K/uL   Eosinophils Relative 0 %   Eosinophils Absolute 0.0 0.0 - 0.5 K/uL   Basophils Relative 0 %   Basophils Absolute 0.0 0.0 - 0.1 K/uL   Immature Granulocytes 1 %   Abs Immature Granulocytes 0.06 0.00  - 0.07 K/uL  Fibrinogen (coagulopathy lab panel)     Status: None   Collection Time: 12/17/18 11:53 PM  Result Value Ref Range   Fibrinogen 235 210 - 475 mg/dL  Protime-INR (coagulopathy lab panel)     Status: None   Collection Time: 12/17/18 11:53 PM  Result Value Ref Range   Prothrombin Time 15.1 11.4 - 15.2 seconds   INR 1.2 0.8 - 1.2  APTT (coagulopathy lab panel)     Status: None   Collection Time: 12/17/18 11:53 PM  Result Value Ref Range   aPTT 32 24 - 36 seconds  Glucose, capillary     Status: Abnormal   Collection Time: 12/18/18  2:17 AM  Result Value Ref Range   Glucose-Capillary 117 (H) 70 - 99 mg/dL   Comment 1 Document in Chart   MRSA PCR Screening     Status: None   Collection Time: 12/18/18  2:25 AM   Specimen: Nasopharyngeal  Result Value Ref Range   MRSA by PCR NEGATIVE NEGATIVE  CBC     Status: Abnormal   Collection Time: 12/18/18  9:30 AM  Result Value Ref Range   WBC 13.2 (H) 4.0 - 10.5 K/uL   RBC 4.59 3.87 - 5.11 MIL/uL   Hemoglobin 12.8 12.0 - 15.0 g/dL   HCT 34.8 (L) 36.0 - 46.0 %   MCV 75.8 (L) 80.0 - 100.0 fL   MCH 27.9 26.0 - 34.0 pg   MCHC 36.8 (H) 30.0 - 36.0 g/dL   RDW 17.5 (H) 11.5 - 15.5 %   Platelets 114 (L) 150 - 400 K/uL   nRBC 0.0 0.0 - 0.2 %  Comprehensive metabolic panel     Status: Abnormal   Collection Time: 12/18/18  9:34 AM  Result Value Ref Range   Sodium 140 135 - 145 mmol/L   Potassium 4.4 3.5 - 5.1 mmol/L   Chloride 113 (H) 98 - 111 mmol/L   CO2 17 (L) 22 - 32 mmol/L   Glucose, Bld 133 (H) 70 - 99 mg/dL   BUN QUANTITY NOT SUFFICIENT, UNABLE TO PERFORM TEST 6 - 20 mg/dL   Creatinine, Ser <0.30 (  L) 0.44 - 1.00 mg/dL   Calcium 7.9 (L) 8.9 - 10.3 mg/dL   Total Protein 5.8 (L) 6.5 - 8.1 g/dL   Albumin 3.3 (L) 3.5 - 5.0 g/dL   AST 104 (H) 15 - 41 U/L   ALT 45 (H) 0 - 44 U/L   Alkaline Phosphatase 35 (L) 38 - 126 U/L   Total Bilirubin 1.3 (H) 0.3 - 1.2 mg/dL   GFR calc non Af Amer NOT CALCULATED >60 mL/min   GFR calc Af Amer  NOT CALCULATED >60 mL/min   Anion gap 10 5 - 15   ----- Larey Days, MD, FACOG Attending Obstetrician and Gynecologist [facility], Department of [facility]

## 2018-12-18 NOTE — Progress Notes (Signed)
Obstetric and Gynecology  POD 0, HD 2  Subjective  Patient doing well, complains of 5/10 pain, but tolerating and improving.  Worst at right shoulder. Tolerating gradual PO intake, tolerating pain with PO meds,  Has not yet gotten OOB and foley in place.  Denies CP, SOB, F/C, N/V/D, or leg pain.  She is tired, wanting to nap. Feels better since scheduled pain meds started Transferred out of ICU earlier this afternoon.  Uneventful ICU stay.    Objective  Objective:   Vitals:   12/18/18 1352 12/18/18 1355 12/18/18 1425 12/18/18 1501  BP: (!) 182/99 (!) 178/99 (!) 177/104 (!) 150/94  Pulse: 90  88 91  Resp: 16  14 15   Temp: 99.6 F (37.6 C)     TempSrc: Oral     SpO2: 99%   98%  Weight:      Height:        Intake/Output Summary (Last 24 hours) at 12/18/2018 1811 Last data filed at 12/18/2018 1300 Gross per 24 hour  Intake 6628.59 ml  Output 6175 ml  Net 453.59 ml   General: NAD Cardiovascular: RRR, no murmurs Pulmonary: CTAB, normal respiratory effort Abdomen: Benign. Non-tender, +BS, no guarding. Incision: not inspected; abdominal binder on. Extremities: No erythema or cords, no calf tenderness, with normal peripheral pulses.  Labs: Results for orders placed or performed during the hospital encounter of 12/17/18 (from the past 24 hour(s))  Prepare RBC (crossmatch)     Status: None   Collection Time: 12/17/18  6:46 PM  Result Value Ref Range   Order Confirmation      ORDER PROCESSED BY BLOOD BANK Performed at [facility], [address] [place], [place] [address]   Prepare fresh frozen plasma     Status: None   Collection Time: 12/17/18  7:29 PM  Result Value Ref Range   Unit Number [id number]    Blood Component Type THW PLS APHR    Unit division 00    Status of Unit ISSUED,FINAL    Transfusion Status OK TO TRANSFUSE    Unit Number [id number]    Blood Component Type THW PLS APHR    Unit division A0    Status of Unit ISSUED,FINAL    Transfusion Status      OK TO TRANSFUSE Performed at [facility], [address] [place], [place] [address]    Unit Number [id number]    Blood Component Type THW PLS APHR    Unit division B0    Status of Unit ISSUED,FINAL    Transfusion Status OK TO TRANSFUSE   Prepare RBC (crossmatch)     Status: None   Collection Time: 12/17/18  9:15 PM  Result Value Ref Range   Order Confirmation      ORDER PROCESSED BY BLOOD BANK Performed at [facility], [address], [place], [place] [address]   Prepare Pheresed Platelets     Status: None (Preliminary result)   Collection Time: 12/17/18  9:16 PM  Result Value Ref Range   Unit Number [id number]    Blood Component Type PLTP LR2 PAS    Unit division 00    Status of Unit ISSUED    Transfusion Status OK TO TRANSFUSE    Unit Number [id number]    Blood Component Type PLTP LR1 PAS    Unit division 00    Status of Unit REL FROM [facility]    Transfusion Status      OK TO TRANSFUSE Performed at [facility],  [place], [place] [address]   Initiate MTP (Blood Bank Notification)     Status: None   Collection Time: 12/17/18  9:33 PM  Result Value Ref Range   Initiate Massive Transfusion Protocol      MTP ORDER RECEIVED DISCONTINUED 12/18/2018 0732 PER TAYLOR FOSTER [facility] Performed at [facility], [address] [place], [place] [address]   CBC with Differential/Platelet     Status: Abnormal   Collection Time: 12/17/18 11:53 PM  Result Value Ref Range   WBC 9.9 4.0 - 10.5 K/uL   RBC 1.66 (L) 3.87 - 5.11 MIL/uL   Hemoglobin 4.4 (LL) 12.0 - 15.0 g/dL   HCT 14.3 (LL) 36.0 - 46.0 %   MCV 86.1 80.0 - 100.0 fL   MCH 26.5 26.0 - 34.0 pg   MCHC 30.8 30.0 - 36.0 g/dL   RDW 18.9 (H) 11.5 - 15.5 %   Platelets 59 (L) 150 - 400 K/uL   nRBC 0.0 0.0 - 0.2 %   Neutrophils Relative % 81 %   Neutro Abs 8.0 (H) 1.7 - 7.7 K/uL   Lymphocytes Relative 6 %   Lymphs Abs 0.6 (L) 0.7 - 4.0 K/uL    Monocytes Relative 12 %   Monocytes Absolute 1.2 (H) 0.1 - 1.0 K/uL   Eosinophils Relative 0 %   Eosinophils Absolute 0.0 0.0 - 0.5 K/uL   Basophils Relative 0 %   Basophils Absolute 0.0 0.0 - 0.1 K/uL   Immature Granulocytes 1 %   Abs Immature Granulocytes 0.06 0.00 - 0.07 K/uL  Fibrinogen (coagulopathy lab panel)     Status: None   Collection Time: 12/17/18 11:53 PM  Result Value Ref Range   Fibrinogen 235 210 - 475 mg/dL  Protime-INR (coagulopathy lab panel)     Status: None   Collection Time: 12/17/18 11:53 PM  Result Value Ref Range   Prothrombin Time 15.1 11.4 - 15.2 seconds   INR 1.2 0.8 - 1.2  APTT (coagulopathy lab panel)     Status: None   Collection Time: 12/17/18 11:53 PM  Result Value Ref Range   aPTT 32 24 - 36 seconds  Glucose, capillary     Status: Abnormal   Collection Time: 12/18/18  2:17 AM  Result Value Ref Range   Glucose-Capillary 117 (H) 70 - 99 mg/dL   Comment 1 Document in Chart   MRSA PCR Screening     Status: None   Collection Time: 12/18/18  2:25 AM   Specimen: Nasopharyngeal  Result Value Ref Range   MRSA by PCR NEGATIVE NEGATIVE  CBC     Status: Abnormal   Collection Time: 12/18/18  9:30 AM  Result Value Ref Range   WBC 13.2 (H) 4.0 - 10.5 K/uL   RBC 4.59 3.87 - 5.11 MIL/uL   Hemoglobin 12.8 12.0 - 15.0 g/dL   HCT 34.8 (L) 36.0 - 46.0 %   MCV 75.8 (L) 80.0 - 100.0 fL   MCH 27.9 26.0 - 34.0 pg   MCHC 36.8 (H) 30.0 - 36.0 g/dL   RDW 17.5 (H) 11.5 - 15.5 %   Platelets 114 (L) 150 - 400 K/uL   nRBC 0.0 0.0 - 0.2 %  Comprehensive metabolic panel     Status: Abnormal   Collection Time: 12/18/18  9:34 AM  Result Value Ref Range   Sodium 140 135 - 145 mmol/L   Potassium 4.4 3.5 - 5.1 mmol/L   Chloride 113 (H) 98 - 111 mmol/L  CO2 17 (L) 22 - 32 mmol/L   Glucose, Bld 133 (H) 70 - 99 mg/dL   BUN QUANTITY NOT SUFFICIENT, UNABLE TO PERFORM TEST 6 - 20 mg/dL   Creatinine, Ser <0.30 (L) 0.44 - 1.00 mg/dL   Calcium 7.9 (L) 8.9 - 10.3 mg/dL    Total Protein 5.8 (L) 6.5 - 8.1 g/dL   Albumin 3.3 (L) 3.5 - 5.0 g/dL   AST 104 (H) 15 - 41 U/L   ALT 45 (H) 0 - 44 U/L   Alkaline Phosphatase 35 (L) 38 - 126 U/L   Total Bilirubin 1.3 (H) 0.3 - 1.2 mg/dL   GFR calc non Af Amer NOT CALCULATED >60 mL/min   GFR calc Af Amer NOT CALCULATED >60 mL/min   Anion gap 10 5 - 15    Cultures: Results for orders placed or performed during the hospital encounter of 12/17/18  MRSA PCR Screening     Status: None   Collection Time: 12/18/18  2:25 AM   Specimen: Nasopharyngeal  Result Value Ref Range Status   MRSA by PCR NEGATIVE NEGATIVE Final    Comment:        The GeneXpert MRSA Assay (FDA approved for NASAL specimens only), is one component of a comprehensive MRSA colonization surveillance program. It is not intended to diagnose MRSA infection nor to guide or monitor treatment for MRSA infections. Performed at [facility], [address] [place], [place] [address]      Assessment   38 y.o. Hospital Day: 2 POD 0 from a Robotic Assisted Laparoscopic Myomectomy with hand morcellation x 12 hours.  Plan   1. Acute blood loss anemia. -s/p 8 total units PRBCs, FFP and Platelets x1.   -repeat H/H this AM was improved from nadir of 4.4/14. see results above.  2. Postop day 0 = she is tolerating her pain now with scheduled meds and her shoulder is beginning to improve - she experienced this with her last surgery as well.  She knows with activity it will improve.  On simethicone.  3. HTN - has baseline mild range HTN (140s-150s) but after surgery has been 170-180s.  S/p a few Hydralazine doses IV, 1 dose lasix after transfusions.  Will give another 40mg  Lasix IV and hopefully will begin to diurese.  Daily weights.  4. Urine output:  Adequate, amber colored.  Maintain foley until more confident for out of bed.  5. VTE: due to blood loss will not start [place].  SCDs in place.  6. Continue inpatient admission, will reassess her  progress for discharge planning.  ----- Larey Days, MD, [place] Attending Obstetrician and Gynecologist [facility], Department of [facility]

## 2018-12-18 NOTE — Progress Notes (Signed)
Pt arrived from the OR awake and alertx4, c/o pain to bilateral arms .pt then admitted to room  Dr.Ward at bedside with patient and admitting to ICU. Pt has abd puncture sites in place dermabond closed with abd. Binder in place. Pt received fentanyl for pain control. Family at bedside for a brief post surgical visit. Pt. Now resting will cont. To monitor

## 2018-12-18 NOTE — Progress Notes (Signed)
Patient transferred to ICU extubated and off pressors.  Has not needed BP support since. Postop pain orders placed, and have been as bedside since transfer. Mother also at bedside.  Patient is not alert, but is conversational and groggy.   Fentanyl ordered PRN.   Will give toradol based on results of next lab draw.    CBC and CMP ordered.  Will stay actively observing her postop recovery and will hopefully transfer out of ICU later this morning.  ----- Larey Days, MD, [place] Attending Obstetrician and Gynecologist [facility], Department of [facility]

## 2018-12-19 LAB — BASIC METABOLIC PANEL
Anion gap: 8 (ref 5–15)
BUN: 8 mg/dL (ref 6–20)
CO2: 23 mmol/L (ref 22–32)
Calcium: 8.2 mg/dL — ABNORMAL LOW (ref 8.9–10.3)
Chloride: 107 mmol/L (ref 98–111)
Creatinine, Ser: 0.84 mg/dL (ref 0.44–1.00)
GFR calc Af Amer: >60 mL/min (ref >60)
GFR calc non Af Amer: >60 mL/min (ref >60)
Glucose, Bld: 116 mg/dL — ABNORMAL HIGH (ref 70–99)
Potassium: 3.2 mmol/L — ABNORMAL LOW (ref 3.5–5.1)
Sodium: 138 mmol/L (ref 135–145)

## 2018-12-19 LAB — TYPE AND SCREEN
ABO/RH(D): O POS
Antibody Screen: NEGATIVE
Unit division: 0
Unit division: 0
Unit division: 0
Unit division: 0
Unit division: 0
Unit division: 0
Unit division: 0
Unit division: 0
Unit division: 0
Unit division: 0
Unit division: 0
Unit division: 0
Unit division: 0
Unit division: 0
Unit division: 0

## 2018-12-19 LAB — PREPARE PLATELET PHERESIS
Unit division: 0
Unit division: 0

## 2018-12-19 LAB — BPAM RBC
Blood Product Expiration Date: 202101122359
Blood Product Expiration Date: 202101152359
Blood Product Expiration Date: 202101152359
Blood Product Expiration Date: 202101152359
Blood Product Expiration Date: 202101152359
Blood Product Expiration Date: 202101152359
Blood Product Expiration Date: 202101152359
Blood Product Expiration Date: 202101162359
Blood Product Expiration Date: 202101162359
Blood Product Expiration Date: 202101162359
Blood Product Expiration Date: 202101172359
Blood Product Expiration Date: 202101172359
Blood Product Expiration Date: 202101172359
Blood Product Expiration Date: 202101182359
Blood Product Expiration Date: 202101182359
ISSUE DATE / TIME: 202012111749
ISSUE DATE / TIME: 202012111749
ISSUE DATE / TIME: 202012111856
ISSUE DATE / TIME: 202012111856
ISSUE DATE / TIME: 202012112043
ISSUE DATE / TIME: 202012112043
ISSUE DATE / TIME: 202012120033
ISSUE DATE / TIME: 202012120033
Unit Type and Rh: 5100
Unit Type and Rh: 5100
Unit Type and Rh: 5100
Unit Type and Rh: 5100
Unit Type and Rh: 5100
Unit Type and Rh: 5100
Unit Type and Rh: 5100
Unit Type and Rh: 5100
Unit Type and Rh: 5100
Unit Type and Rh: 5100
Unit Type and Rh: 5100
Unit Type and Rh: 5100
Unit Type and Rh: 5100
Unit Type and Rh: 5100
Unit Type and Rh: 5100

## 2018-12-19 LAB — CBC
HCT: 32.3 % — ABNORMAL LOW (ref 36.0–46.0)
Hemoglobin: 11.1 g/dL — ABNORMAL LOW (ref 12.0–15.0)
MCH: 27.5 pg (ref 26.0–34.0)
MCHC: 34.4 g/dL (ref 30.0–36.0)
MCV: 80 fL (ref 80.0–100.0)
Platelets: 116 10*3/uL — ABNORMAL LOW (ref 150–400)
RBC: 4.04 MIL/uL (ref 3.87–5.11)
RDW: 18.3 % — ABNORMAL HIGH (ref 11.5–15.5)
WBC: 11.3 10*3/uL — ABNORMAL HIGH (ref 4.0–10.5)
nRBC: 0 % (ref 0.0–0.2)

## 2018-12-19 LAB — BPAM PLATELET PHERESIS
Blood Product Expiration Date: 202012132359
Blood Product Expiration Date: 202012132359
ISSUE DATE / TIME: 202012120033
ISSUE DATE / TIME: 202012120033
Unit Type and Rh: 6200
Unit Type and Rh: 6200

## 2018-12-19 MED ORDER — FUROSEMIDE 10 MG/ML IJ SOLN
20.0000 mg | Freq: Once | INTRAMUSCULAR | Status: AC
  Administered 2018-12-19: 20 mg via INTRAVENOUS
  Filled 2018-12-19: qty 2

## 2018-12-19 MED ORDER — POTASSIUM CHLORIDE IN NACL 40-0.9 MEQ/L-% IV SOLN
Freq: Once | INTRAVENOUS | Status: AC
  Administered 2018-12-19: 19:00:00 500 mL/h via INTRAVENOUS
  Filled 2018-12-19: qty 1000

## 2018-12-19 MED ORDER — BACITRACIN-NEOMYCIN-POLYMYXIN OINTMENT TUBE
TOPICAL_OINTMENT | CUTANEOUS | Status: DC
  Administered 2018-12-19: 1 via TOPICAL
  Administered 2018-12-20: 09:00:00 via TOPICAL
  Filled 2018-12-19: qty 14.17

## 2018-12-19 MED ORDER — POTASSIUM CHLORIDE IN NACL 40-0.9 MEQ/L-% IV SOLN
INTRAVENOUS | Status: DC
  Filled 2018-12-19 (×11): qty 1000

## 2018-12-19 NOTE — Progress Notes (Signed)
Obstetric and Gynecology  POD 1, HD 3  Subjective  Patient doing well, pain is improving.  Still worst at right shoulder. Tolerating PO intake, tolerating pain with PO meds,  Has walked hallways and has not yet voided after foley removed. Denies CP, SOB, F/C, N/V/D, or leg pain.   Objective  Objective:   Vitals:   12/19/18 0618 12/19/18 0745 12/19/18 1130 12/19/18 1547  BP: (!) 148/84 (!) 152/87 (!) 143/76 (!) 141/70  Pulse: 98 94 86 100  Resp: 19 (!) 21 15 20   Temp:  98.6 F (37 C) 98.8 F (37.1 C) 99.8 F (37.7 C)  TempSrc:  Oral Oral Oral  SpO2: 98% 100% 100%   Weight:      Height:        Intake/Output Summary (Last 24 hours) at 12/19/2018 1735 Last data filed at 12/19/2018 1600 Gross per 24 hour  Intake 685.35 ml  Output 1960 ml  Net -1274.65 ml   General: NAD Cardiovascular: RRR, no murmurs Pulmonary: CTAB, normal respiratory effort Abdomen: Benign. Non-tender, +BS, no guarding. Incision: not inspected; abdominal binder on. Extremities: No erythema or cords, no calf tenderness, with normal peripheral pulses.  Labs: Results for orders placed or performed during the hospital encounter of 12/17/18 (from the past 24 hour(s))  CBC     Status: Abnormal   Collection Time: 12/19/18  5:51 AM  Result Value Ref Range   WBC 11.3 (H) 4.0 - 10.5 K/uL   RBC 4.04 3.87 - 5.11 MIL/uL   Hemoglobin 11.1 (L) 12.0 - 15.0 g/dL   HCT 32.3 (L) 36.0 - 46.0 %   MCV 80.0 80.0 - 100.0 fL   MCH 27.5 26.0 - 34.0 pg   MCHC 34.4 30.0 - 36.0 g/dL   RDW 18.3 (H) 11.5 - 15.5 %   Platelets 116 (L) 150 - 400 K/uL   nRBC 0.0 0.0 - 0.2 %  Basic metabolic panel     Status: Abnormal   Collection Time: 12/19/18  5:51 AM  Result Value Ref Range   Sodium 138 135 - 145 mmol/L   Potassium 3.2 (L) 3.5 - 5.1 mmol/L   Chloride 107 98 - 111 mmol/L   CO2 23 22 - 32 mmol/L   Glucose, Bld 116 (H) 70 - 99 mg/dL   BUN 8 6 - 20 mg/dL   Creatinine, Ser 0.84 0.44 - 1.00 mg/dL   Calcium 8.2 (L) 8.9 - 10.3  mg/dL   GFR calc non Af Amer >60 >60 mL/min   GFR calc Af Amer >60 >60 mL/min   Anion gap 8 5 - 15    Cultures: Results for orders placed or performed during the hospital encounter of 12/17/18  MRSA PCR Screening     Status: None   Collection Time: 12/18/18  2:25 AM   Specimen: Nasopharyngeal  Result Value Ref Range Status   MRSA by PCR NEGATIVE NEGATIVE Final    Comment:        The GeneXpert MRSA Assay (FDA approved for NASAL specimens only), is one component of a comprehensive MRSA colonization surveillance program. It is not intended to diagnose MRSA infection nor to guide or monitor treatment for MRSA infections. Performed at [facility], [address] [place], [place] [address]      Assessment   38 y.o. Hospital Day: 3 POD 0 from a Robotic Assisted Laparoscopic Myomectomy with hand morcellation x 12 hours.  Plan   1. Acute blood loss anemia. -s/p 8 total units PRBCs, FFP  and Platelets x1.   -repeat H/H this AM was improved from nadir of 4.4/14. see results above.  2. Postop day 0 = she is tolerating her pain now with scheduled meds and her shoulder is beginning to improve - she experienced this with her last surgery as well.  She knows with activity it will improve.  On simethicone.  3. HTN - has baseline mild range HTN (140s-150s) but after surgery has been 170-180s.  S/p a few Hydralazine doses IV, 1 dose lasix after transfusions.  Will give another 40mg  Lasix IV and hopefully will begin to diurese.  Daily weights.  4. Urine output:  Adequate, amber colored.  Maintain foley until more confident for out of bed.  5. VTE: due to blood loss will not start [place].  SCDs in place.  6. Continue inpatient admission, will reassess her progress for discharge planning.  ----- Larey Days, MD, [place] Attending Obstetrician and Gynecologist [facility], Department of [facility]

## 2018-12-20 MED ORDER — FUROSEMIDE 10 MG/ML IJ SOLN
20.0000 mg | Freq: Once | INTRAMUSCULAR | Status: AC
  Administered 2018-12-20: 20 mg via INTRAVENOUS
  Filled 2018-12-20: qty 2

## 2018-12-20 NOTE — Progress Notes (Signed)
Obstetric and Gynecology  POD 2, HD 4  Subjective  Patient doing well, walking in hallways, tolerating PO intake, tolerating pain with PO meds,  Has not yet gotten OOB and foley in place.  Denies CP, SOB, F/C, N/V/D, or leg pain.  Hypertensive overnight, asymptomatic.  Given Lasix with good response.    Objective  Objective:   Vitals:   12/20/18 0731 12/20/18 1214 12/20/18 1230 12/20/18 1604  BP: (!) 158/91 (!) 165/83 (!) 153/84 133/79  Pulse: 94 88  85  Resp: 20 18  20   Temp: 98.4 F (36.9 C) 98.3 F (36.8 C)  99.3 F (37.4 C)  TempSrc: Oral Oral  Oral  SpO2: 96% 100%  100%  Weight:      Height:        Intake/Output Summary (Last 24 hours) at 12/20/2018 1949 Last data filed at 12/20/2018 1400 Gross per 24 hour  Intake 360 ml  Output 1800 ml  Net -1440 ml   General: NAD Cardiovascular: RRR, no murmurs Pulmonary: CTAB, normal respiratory effort Abdomen: Benign. Non-tender, +BS, no guarding. Incision: not inspected; abdominal binder on. Extremities: No erythema or cords, no calf tenderness, with normal peripheral pulses.    Assessment   38 y.o. Hospital Day: 4 POD 2 from a Robotic Assisted Laparoscopic Myomectomy with hand morcellation x 12 hours.  Plan   1. Acute blood loss anemia. -s/p 8 total units PRBCs, FFP and Platelets x1.   -repeat H/H this AM was improved from nadir of 4.4/14. see results above.  2. Postop day 2 = she is tolerating her pain now with scheduled meds and her shoulder is beginning to improve - she experienced this with her last surgery as well.  She knows with activity it will improve.  On simethicone.  3. HTN - has baseline mild range HTN (140s-150s) but after surgery has been 170-180s occasionally.  4. Urine output:  Adequate  5. VTE: due to blood loss will not start [facility].  SCDs in place.  6. Continue inpatient admission, plan for discharge tomorrow.  ----- Larey Days, MD, [place] Attending Obstetrician and Gynecologist [facility], Department of [facility]

## 2018-12-21 ENCOUNTER — Inpatient Hospital Stay: Payer: Commercial Managed Care - PPO

## 2018-12-21 LAB — SURGICAL PATHOLOGY

## 2018-12-21 MED ORDER — ENOXAPARIN SODIUM 40 MG/0.4ML ~~LOC~~ SOLN: 40.0000 mg | Freq: Two times a day (BID) | SUBCUTANEOUS | Status: DC

## 2018-12-21 MED ORDER — IBUPROFEN 800 MG PO TABS: 800.0000 mg | ORAL_TABLET | Freq: Four times a day (QID) | ORAL | 1 refills | Status: DC

## 2018-12-21 MED ORDER — OXYCODONE HCL 5 MG PO TABS: 5.0000 mg | ORAL_TABLET | ORAL | 0 refills | Status: DC | PRN

## 2018-12-21 MED ORDER — ENOXAPARIN SODIUM 40 MG/0.4ML ~~LOC~~ SOLN: 40.0000 mg | SUBCUTANEOUS | 0 refills | Status: DC

## 2018-12-21 MED ORDER — ENOXAPARIN SODIUM 40 MG/0.4ML ~~LOC~~ SOLN
40.0000 mg | Freq: Two times a day (BID) | SUBCUTANEOUS | Status: DC
  Administered 2018-12-21: 40 mg via SUBCUTANEOUS
  Filled 2018-12-21: qty 0.4

## 2018-12-21 NOTE — Progress Notes (Signed)
DC to home.  To car via wc.

## 2018-12-21 NOTE — Progress Notes (Addendum)
Dr. Leonides Schanz notified of elevated BP, she stated to dose with hydralazine as ordered if SBP is over 180. Patients nurse notified.

## 2018-12-21 NOTE — Progress Notes (Signed)
DC inst given to pt.  Verb u/o of care of self and how to manage self and meds at home.  Verb u/o.

## 2018-12-21 NOTE — Progress Notes (Signed)
Anticoagulation monitoring(Lovenox):  38yo  female ordered Lovenox 40 mg Q24h  Filed Weights   12/18/18 0236 12/20/18 0654 12/21/18 0948  Weight: (!) 311 lb 11.7 oz (141.4 kg) (!) 305 lb 11.2 oz (138.7 kg) (!) 314 lb 9.5 oz (142.7 kg)   BMI 54   Lab Results  Component Value Date   CREATININE 0.84 12/19/2018   CREATININE <0.30 (L) 12/18/2018   CREATININE 0.85 12/14/2018   Estimated Creatinine Clearance: 127.6 mL/min (by C-G formula based on SCr of 0.84 mg/dL). Hemoglobin & Hematocrit     Component Value Date/Time   HGB 11.1 (L) 12/19/2018 0551   HCT 32.3 (L) 12/19/2018 0551     Per Protocol for Patient with estCrcl > 30 ml/min and BMI > 40, will transition to Lovenox 40 mg Q12h.

## 2018-12-21 NOTE — Anesthesia Postprocedure Evaluation (Signed)
Anesthesia Post Note  Patient: Brenda Mercado  Procedure(s) Performed: XI ROBOTIC ASSISTED MYOMECTOMY, WITH HAND MORCELLATION (N/A )  Patient location during evaluation: ICU Anesthesia Type: General Level of consciousness: awake and alert and oriented Pain management: pain level controlled Vital Signs Assessment: post-procedure vital signs reviewed and stable Respiratory status: spontaneous breathing Cardiovascular status: blood pressure returned to baseline Anesthetic complications: no Comments: Acute blood loss anemia adequately replaced with blood products and able to come off of pressors at the end of the case.     Last Vitals:  Vitals:   12/21/18 0842 12/21/18 1056  BP: (!) 153/71 (!) 145/90  Pulse: 83 85  Resp: 18 18  Temp: 37.1 C   SpO2: 98%     Last Pain:  Vitals:   12/21/18 1056  TempSrc: Oral  PainSc:                  Dequante Tremaine

## 2018-12-21 NOTE — Discharge Instructions (Signed)
Discharge instructions:  Call office if you have any of the following: fever >101 F, chills, shortness of breath, excessive vaginal bleeding, incision drainage or problems, leg pain or redness, or any other concerns.   Activity: Do not lift > 10 lbs for 8 weeks.  No intercourse or tampons for 8 weeks.  No driving for 1-2 weeks.   You may feel some pain in your upper right abdomen/rib and right shoulder.  This is from the gas in the abdomen for surgery. This will subside over time, please be patient!  Take 800mg  Ibuprofen and 1000mg  Tylenol around the clock, every 6 hours for at least the first 3-5 days.  After this you can take as needed.  This will help decrease inflammation and promote healing.  The narcotics you'll take just as needed, as they just trick your brain into thinking its not in pain.  Please do lovenox injections daily x 14 days.  Please don't limit yourself in terms of routine activity.  You will be able to do most things, although they may take longer to do or be a little painful.  You can do it!  Don't be a hero, but don't be a wimp either!

## 2018-12-28 ENCOUNTER — Encounter: Payer: Self-pay | Admitting: *Deleted

## 2019-01-07 NOTE — Discharge Summary (Signed)
Gynecology Discharge Summary  Patient ID: Brenda Mercado MRN: [id number] DOB/AGE: Oct 14, 1980 39 y.o.  Admit Date: 12/17/2018 Discharge Date: 01/07/2019  Preoperative Diagnoses: massive fibroid uterus Postoperative Diagnoses: massive fibroid uterus, acute blood loss  Procedures: Procedure(s) (LRB): XI ROBOTIC ASSISTED MYOMECTOMY, WITH HAND MORCELLATION (N/A)  CBC Latest Ref Rng & Units 12/19/2018 12/18/2018 12/17/2018  WBC 4.0 - 10.5 K/uL 11.3(H) 13.2(H) 9.9  Hemoglobin 12.0 - 15.0 g/dL 11.1(L) 12.8 4.4(LL)  Hematocrit 36.0 - 46.0 % 32.3(L) 34.8(L) 14.3(LL)  Platelets 150 - 400 K/uL 116(L) 114(L) 59(L)    Hospital Course:  Brenda Mercado is a 39 y.o.admitted for scheduled surgery.  She underwent the procedures as mentioned above, her operation was complicated by hypotension secondary to both blood loss and IVC compression by fibroids. For further details about surgery, please refer to the operative report. She was brought to the ICU due to intraoperative issues, however did not require pressors or acute care, and was discharged later that morning.  Otherwise, patient had an uncomplicated postoperative course. By time of discharge on POD#3, her pain was controlled on oral pain medications; she was ambulating, voiding without difficulty, tolerating regular diet and passing flatus. She was deemed stable for discharge to home.    Discharge Exam: BP (!) 148/85 (BP Location: Right Arm) Comment: nurse Sheela Stack notified  Pulse 94   Temp 98.9 F (37.2 C) (Oral)   Resp 18   Ht 5\' 4"  (1.626 m)   Wt (!) 142.7 kg   LMP 11/25/2018   SpO2 100%   BMI 54.00 kg/m  General appearance: alert and no distress  Resp: clear to auscultation bilaterally, normal respiratory effort Cardio: regular rate and rhythm  GI: soft, non-tender; bowel sounds normal; no masses, no organomegaly.  Incision: C/D/I, no erythema, no drainage noted Pelvic: scant blood on pad  Extremities: extremities normal, atraumatic,  no cyanosis or edema and Homans sign is negative, no sign of DVT  Discharged Condition: Stable  Disposition: Discharge disposition: 01-Home or Self Care       Discharge Instructions    Diet - low sodium heart healthy   Complete by: As directed    Increase activity slowly   Complete by: As directed      Allergies as of 12/21/2018   No Known Allergies     Medication List    TAKE these medications   cetirizine 10 MG tablet Commonly known as: ZYRTEC Take 10 mg by mouth daily.   enoxaparin 40 MG/0.4ML injection Commonly known as: LOVENOX Inject 0.4 mLs (40 mg total) into the skin daily for 14 days.   fluticasone 50 MCG/ACT nasal spray Commonly known as: FLONASE Place 1 spray into both nostrils daily as needed for allergies.   hydroxypropyl methylcellulose / hypromellose 2.5 % ophthalmic solution Commonly known as: ISOPTO TEARS / GONIOVISC Place 1 drop into both eyes 3 (three) times daily as needed for dry eyes.   ibuprofen 800 MG tablet Commonly known as: ADVIL Take 1 tablet (800 mg total) by mouth every 6 (six) hours.   oxyCODONE 5 MG immediate release tablet Commonly known as: Oxy IR/ROXICODONE Take 1-2 tablets (5-10 mg total) by mouth every 4 (four) hours as needed for moderate pain.   SUMAtriptan 25 MG tablet Commonly known as: IMITREX Take 25 mg by mouth every 2 (two) hours as needed for migraine.      Follow-up Information    Katlynne Mckercher, Honor Loh, MD Follow up in 2 week(s).   Specialty: Obstetrics and Gynecology Why: video visit  or phone call unless you need to be seen! Contact information: [place] [address] [phone]           Signed:  [address] Attending [facility]N [facility]

## 2019-01-31 ENCOUNTER — Other Ambulatory Visit: Payer: Self-pay | Admitting: Obstetrics & Gynecology

## 2019-01-31 DIAGNOSIS — Z1231 Encounter for screening mammogram for malignant neoplasm of breast: Secondary | ICD-10-CM

## 2019-03-14 ENCOUNTER — Other Ambulatory Visit: Payer: Self-pay

## 2019-03-14 ENCOUNTER — Ambulatory Visit
Admission: RE | Admit: 2019-03-14 | Discharge: 2019-03-14 | Disposition: A | Discharge status: Home / Self Care | Payer: Commercial Managed Care - PPO | Source: Ambulatory Visit | Attending: Obstetrics & Gynecology | Admitting: Obstetrics & Gynecology

## 2019-03-14 DIAGNOSIS — Z1231 Encounter for screening mammogram for malignant neoplasm of breast: Secondary | ICD-10-CM | POA: Insufficient documentation

## 2019-12-28 ENCOUNTER — Other Ambulatory Visit: Payer: Self-pay | Admitting: Obstetrics & Gynecology

## 2019-12-28 DIAGNOSIS — Z1231 Encounter for screening mammogram for malignant neoplasm of breast: Secondary | ICD-10-CM

## 2020-03-14 ENCOUNTER — Ambulatory Visit
Admission: RE | Admit: 2020-03-14 | Discharge: 2020-03-14 | Disposition: A | Discharge status: Home / Self Care | Payer: Commercial Managed Care - PPO | Source: Ambulatory Visit | Attending: Obstetrics & Gynecology | Admitting: Obstetrics & Gynecology

## 2020-03-14 ENCOUNTER — Other Ambulatory Visit: Payer: Self-pay

## 2020-03-14 DIAGNOSIS — Z1231 Encounter for screening mammogram for malignant neoplasm of breast: Secondary | ICD-10-CM | POA: Diagnosis not present

## 2021-02-20 ENCOUNTER — Other Ambulatory Visit: Payer: Self-pay | Admitting: Nurse Practitioner

## 2021-06-13 IMAGING — MG DIGITAL SCREENING BILAT W/ TOMO W/ CAD
6 of 10 series · 6 of 30 positions shown · non-contrast
Comparison: Previous exam(s).

CLINICAL DATA: Screening.

EXAM:
DIGITAL SCREENING BILATERAL MAMMOGRAM WITH TOMO AND CAD

[R CC synth-2D]
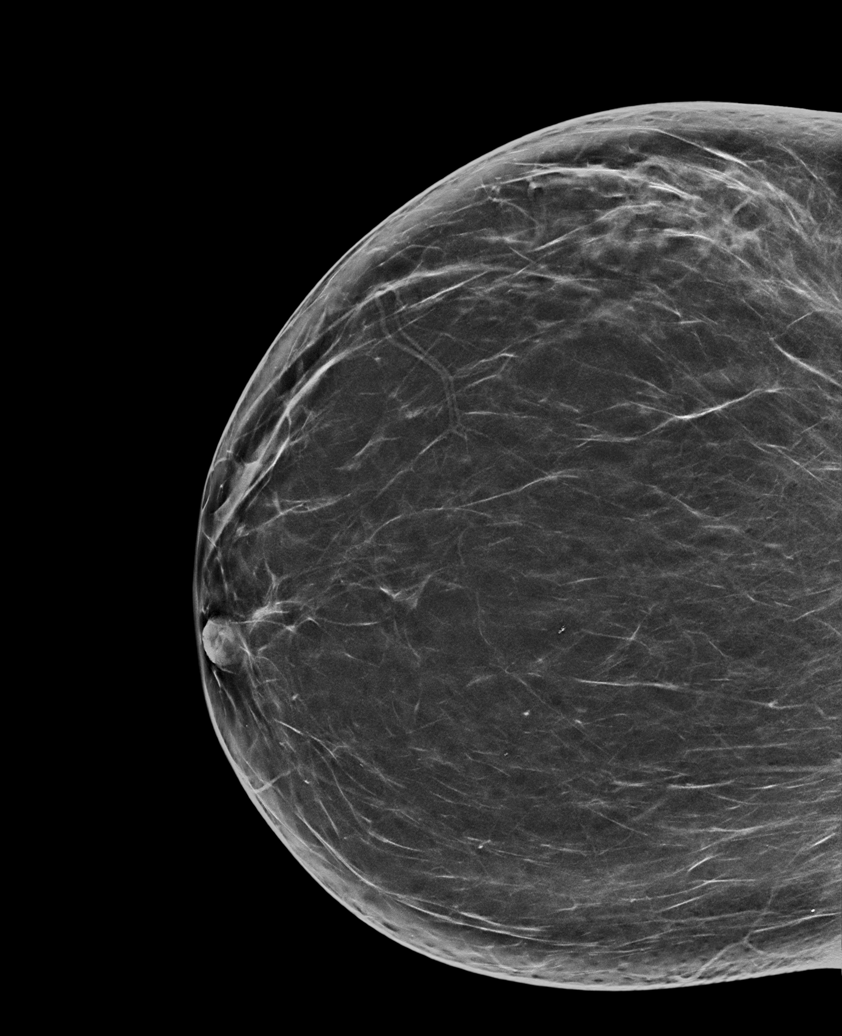

[R MLO synth-2D]
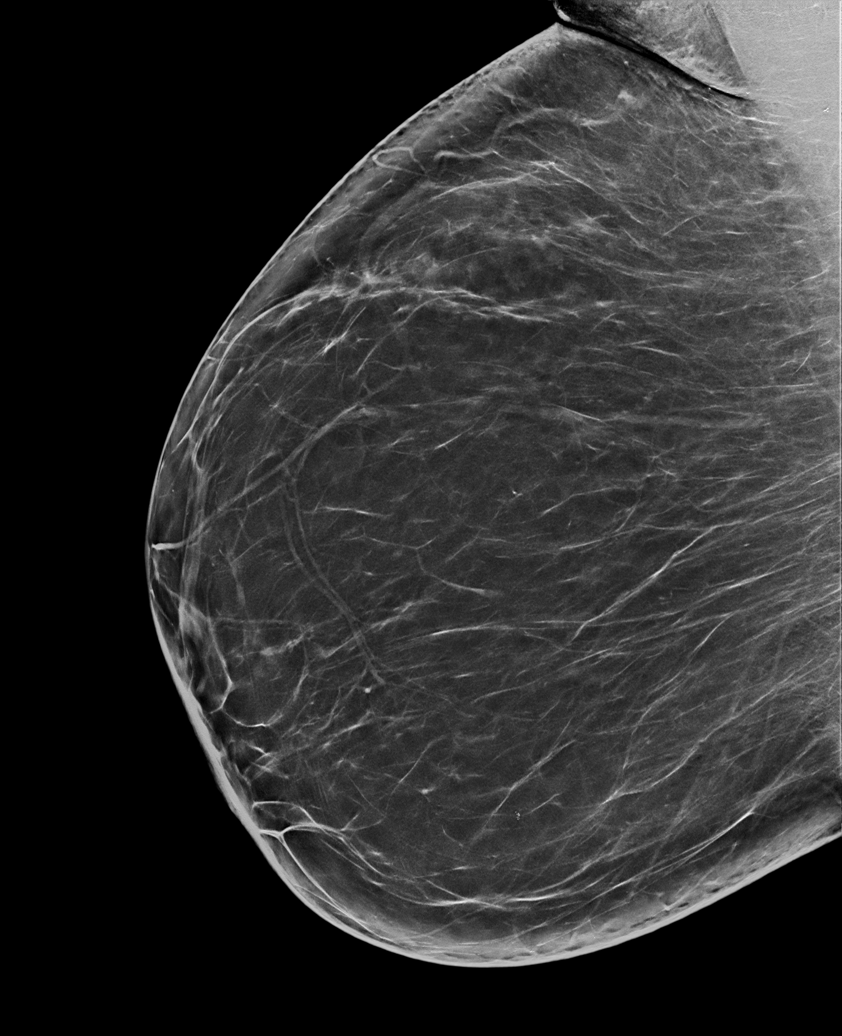

[L CC synth-2D (1 of 2)]
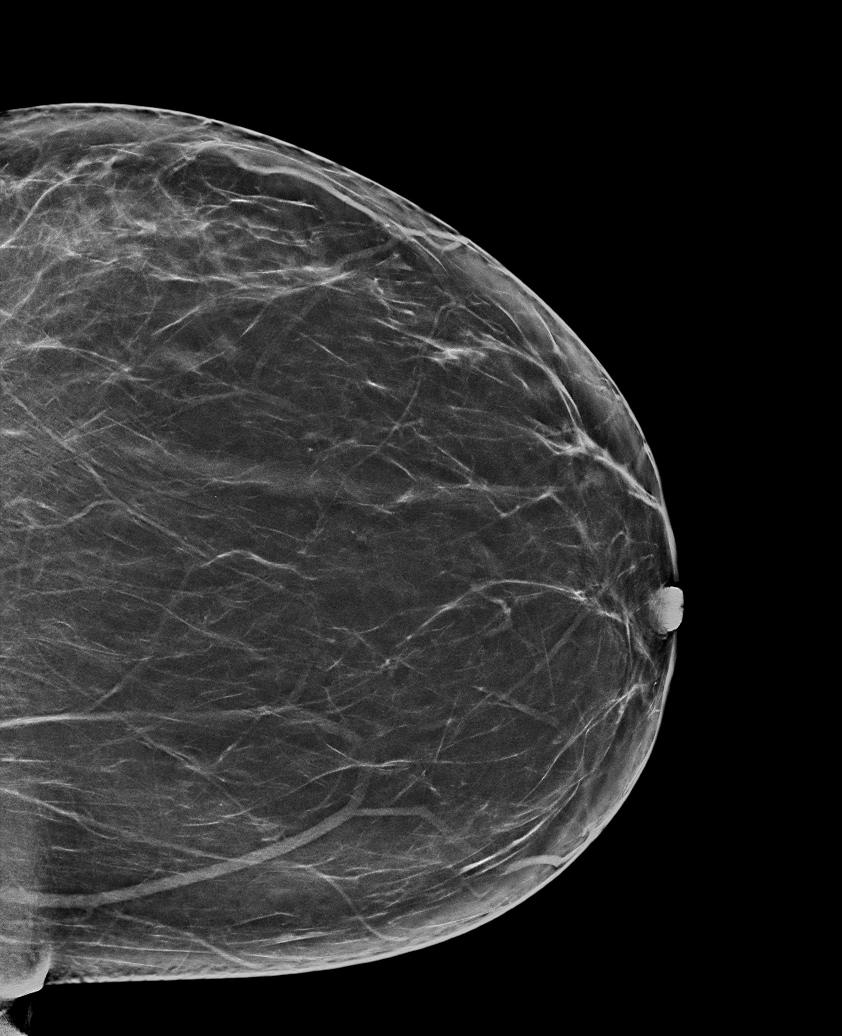

[L MLO synth-2D]
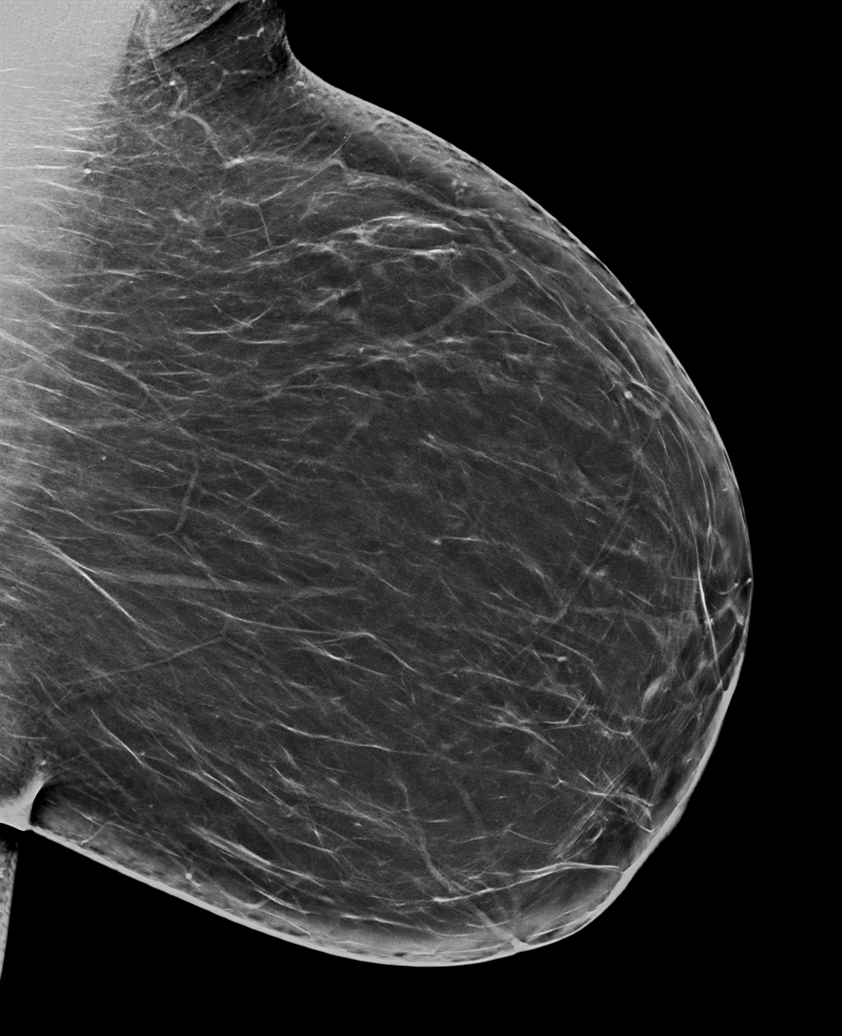

[L CC synth-2D (2 of 2)]
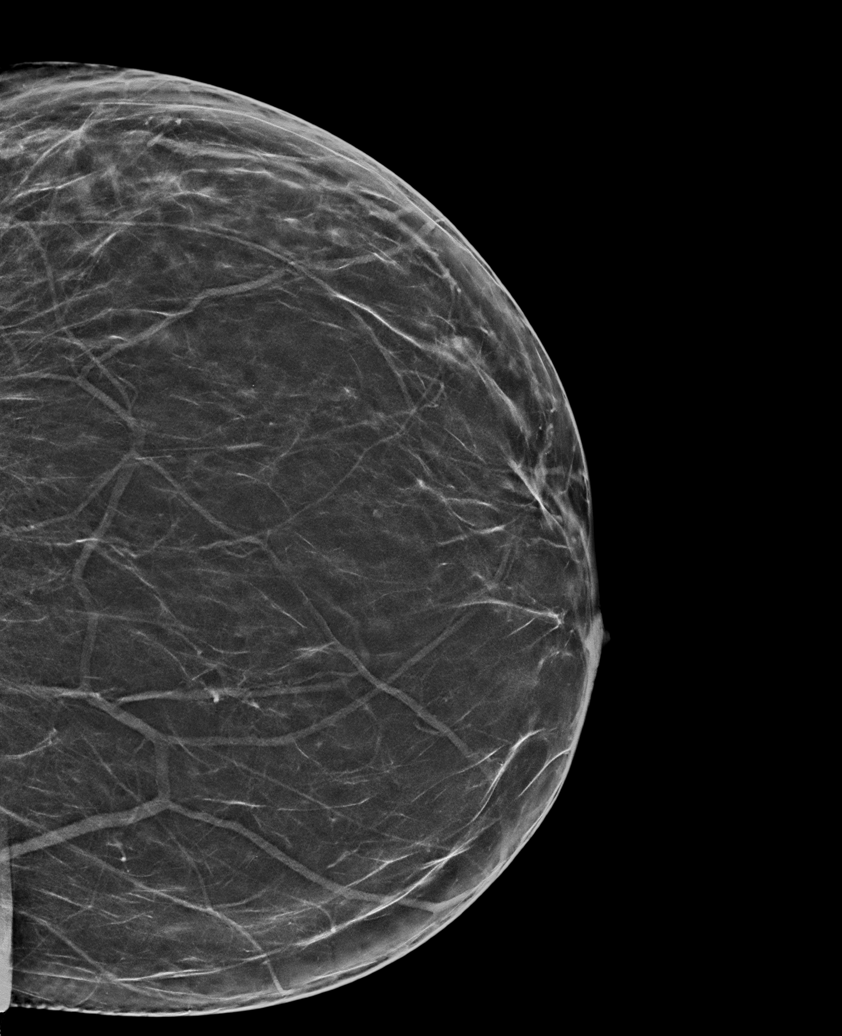

[L MLO tomo · tomo slice 42/83.0]
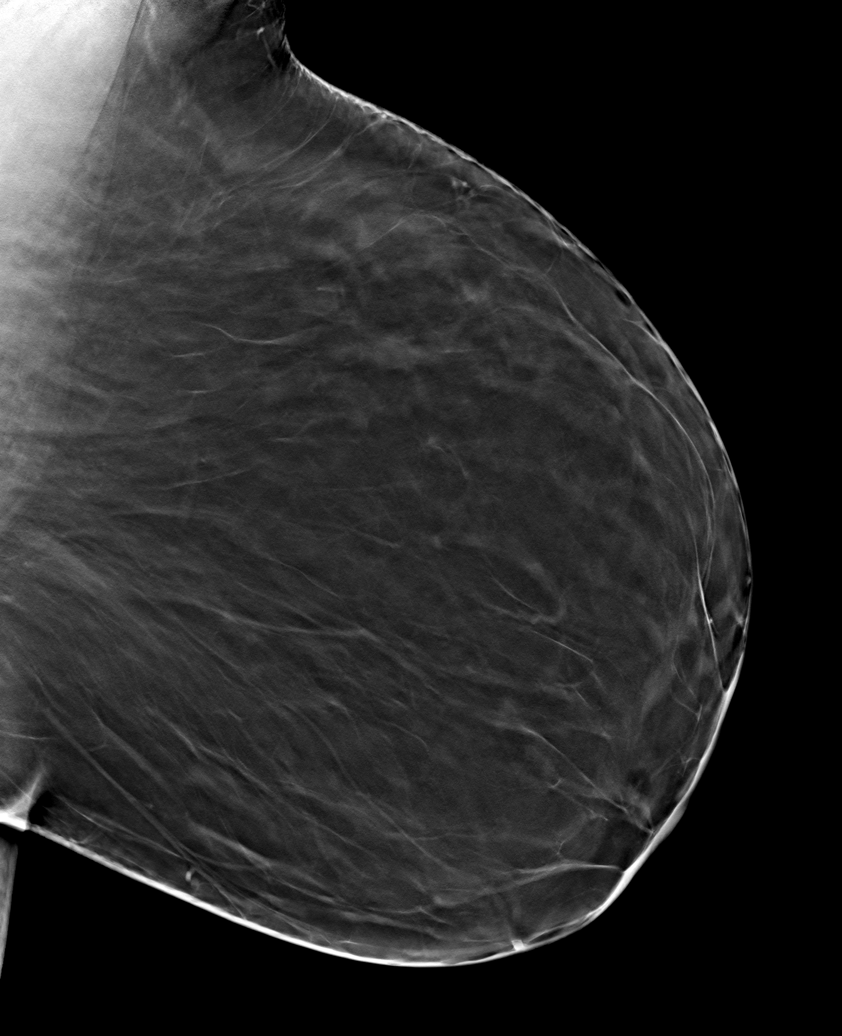

[6 of 30 positions shown; findings below may reference images not displayed]

ACR Breast Density Category b: There are scattered areas of
fibroglandular density.
FINDINGS: There are no findings suspicious for malignancy. Images were
processed with CAD.
IMPRESSION: No mammographic evidence of malignancy. A result letter of this
screening mammogram will be mailed directly to the patient.

RECOMMENDATION:
Screening mammogram in one year. (Code:[id number]-775)

BI-RADS CATEGORY  1: Negative.

## 2022-05-02 ENCOUNTER — Other Ambulatory Visit: Payer: Self-pay | Admitting: Obstetrics and Gynecology

## 2022-05-02 DIAGNOSIS — E041 Nontoxic single thyroid nodule: Secondary | ICD-10-CM

## 2022-06-09 ENCOUNTER — Ambulatory Visit
Admission: RE | Admit: 2022-06-09 | Discharge: 2022-06-09 | Disposition: A | Discharge status: Home / Self Care | Payer: BC Managed Care – PPO | Source: Ambulatory Visit | Attending: Obstetrics and Gynecology | Admitting: Obstetrics and Gynecology

## 2022-06-09 DIAGNOSIS — E041 Nontoxic single thyroid nodule: Secondary | ICD-10-CM

## 2023-04-13 ENCOUNTER — Other Ambulatory Visit: Payer: Self-pay | Admitting: Gastroenterology

## 2023-06-16 ENCOUNTER — Encounter (HOSPITAL_COMMUNITY): Payer: Self-pay | Admitting: Gastroenterology

## 2023-06-23 ENCOUNTER — Ambulatory Visit (HOSPITAL_COMMUNITY)
Admission: RE | Admit: 2023-06-23 | Discharge: 2023-06-23 | Disposition: A | Discharge status: Home / Self Care | Attending: Gastroenterology | Admitting: Gastroenterology

## 2023-06-23 ENCOUNTER — Encounter (HOSPITAL_COMMUNITY): Payer: Self-pay | Admitting: Gastroenterology

## 2023-06-23 ENCOUNTER — Other Ambulatory Visit: Payer: Self-pay

## 2023-06-23 ENCOUNTER — Ambulatory Visit (HOSPITAL_COMMUNITY): Admitting: Certified Registered"

## 2023-06-23 ENCOUNTER — Encounter (HOSPITAL_COMMUNITY)
Admission: RE | Disposition: A | Discharge status: Home / Self Care | Payer: Self-pay | Source: Home / Self Care | Attending: Gastroenterology

## 2023-06-23 DIAGNOSIS — K319 Disease of stomach and duodenum, unspecified: Secondary | ICD-10-CM | POA: Diagnosis not present

## 2023-06-23 DIAGNOSIS — K5900 Constipation, unspecified: Secondary | ICD-10-CM | POA: Diagnosis not present

## 2023-06-23 DIAGNOSIS — R194 Change in bowel habit: Secondary | ICD-10-CM | POA: Diagnosis not present

## 2023-06-23 DIAGNOSIS — K2950 Unspecified chronic gastritis without bleeding: Secondary | ICD-10-CM | POA: Insufficient documentation

## 2023-06-23 DIAGNOSIS — Z6841 Body Mass Index (BMI) 40.0 and over, adult: Secondary | ICD-10-CM | POA: Diagnosis not present

## 2023-06-23 DIAGNOSIS — D509 Iron deficiency anemia, unspecified: Secondary | ICD-10-CM | POA: Insufficient documentation

## 2023-06-23 DIAGNOSIS — K3189 Other diseases of stomach and duodenum: Secondary | ICD-10-CM | POA: Diagnosis not present

## 2023-06-23 DIAGNOSIS — E6689 Other obesity not elsewhere classified: Secondary | ICD-10-CM | POA: Insufficient documentation

## 2023-06-23 DIAGNOSIS — K648 Other hemorrhoids: Secondary | ICD-10-CM | POA: Diagnosis not present

## 2023-06-23 DIAGNOSIS — K2100 Gastro-esophageal reflux disease with esophagitis, without bleeding: Secondary | ICD-10-CM | POA: Diagnosis not present

## 2023-06-23 DIAGNOSIS — R12 Heartburn: Secondary | ICD-10-CM | POA: Diagnosis present

## 2023-06-23 DIAGNOSIS — K644 Residual hemorrhoidal skin tags: Secondary | ICD-10-CM | POA: Insufficient documentation

## 2023-06-23 HISTORY — PX: COLONOSCOPY: SHX5424

## 2023-06-23 HISTORY — PX: ESOPHAGOGASTRODUODENOSCOPY: SHX5428

## 2023-06-23 LAB — PREGNANCY, URINE: Preg Test, Ur: NEGATIVE

## 2023-06-23 SURGERY — COLONOSCOPY
Anesthesia: Monitor Anesthesia Care

## 2023-06-23 MED ORDER — PROPOFOL 500 MG/50ML IV EMUL
INTRAVENOUS | Status: AC
  Filled 2023-06-23: qty 50

## 2023-06-23 MED ORDER — PROPOFOL 1000 MG/100ML IV EMUL
INTRAVENOUS | Status: AC
  Filled 2023-06-23: qty 100

## 2023-06-23 MED ORDER — SODIUM CHLORIDE 0.9 % IV SOLN
INTRAVENOUS | Status: AC | PRN
  Administered 2023-06-23: 500 mL via INTRAMUSCULAR

## 2023-06-23 MED ORDER — PROPOFOL 10 MG/ML IV BOLUS
INTRAVENOUS | Status: DC | PRN
  Administered 2023-06-23: 10 mg via INTRAVENOUS
  Administered 2023-06-23: 20 mg via INTRAVENOUS
  Administered 2023-06-23: 10 mg via INTRAVENOUS

## 2023-06-23 MED ORDER — LIDOCAINE HCL 1 % IJ SOLN
INTRAMUSCULAR | Status: DC | PRN
  Administered 2023-06-23: 50 mg via INTRADERMAL

## 2023-06-23 MED ORDER — PROPOFOL 500 MG/50ML IV EMUL
INTRAVENOUS | Status: DC | PRN
  Administered 2023-06-23: 130 ug/kg/min via INTRAVENOUS

## 2023-06-23 NOTE — Discharge Instructions (Signed)

## 2023-06-23 NOTE — Op Note (Signed)
 [facility] Patient Name: Brenda Mercado Procedure Date: 06/23/2023 MRN: [id number] Attending MD: Felecia Hopper , MD, [id number] Date of Birth: 09-01-1980 CSN: [id number] Age: 43 Admit Type: Outpatient Procedure:                Upper GI endoscopy Indications:              Heartburn Providers:                Felecia Hopper, MD, Golda Latch, RN, Rinda Cheers, Technician Referring MD:              Medicines:                Sedation Administered by an Anesthesia Professional Complications:            No immediate complications. Estimated Blood Loss:     Estimated blood loss was minimal. Estimated blood                            loss was minimal. Procedure:                Pre-Anesthesia Assessment:                           - Prior to the procedure, a History and Physical                            was performed, and patient medications and                            allergies were reviewed. The patient's tolerance of                            previous anesthesia was also reviewed. The risks                            and benefits of the procedure and the sedation                            options and risks were discussed with the patient.                            All questions were answered, and informed consent                            was obtained. Prior Anticoagulants: The patient has                            taken no anticoagulant or antiplatelet agents. ASA                            Grade Assessment: IV - A patient with severe  systemic disease that is a constant threat to life.                            After reviewing the risks and benefits, the patient                            was deemed in satisfactory condition to undergo the                            procedure.                           After obtaining informed consent, the endoscope was                            passed under direct  vision. Throughout the                            procedure, the patient's blood pressure, pulse, and                            oxygen saturations were monitored continuously. The                            [id number]-[id number] ([id number]) Olympus endoscope was introduced                            through the mouth, and advanced to the second part                            of duodenum. The upper GI endoscopy was                            accomplished without difficulty. The patient                            tolerated the procedure well. Scope In: Scope Out: Findings:      LA Grade B (one or more mucosal breaks greater than 5 mm, not extending       between the tops of two mucosal folds) esophagitis with no bleeding was       found at the gastroesophageal junction. Biopsies were taken with a cold       forceps for histology.      Patchy mildly erythematous mucosa was found in the prepyloric region of       the stomach. Biopsies were taken with a cold forceps for histology.      The cardia and gastric fundus were normal on retroflexion.      The duodenal bulb, first portion of the duodenum and second portion of       the duodenum were normal. Impression:               - LA Grade B reflux esophagitis with no bleeding.  Biopsied.                           - Erythematous mucosa in the prepyloric region of                            the stomach. Biopsied.                           - Normal duodenal bulb, first portion of the                            duodenum and second portion of the duodenum. Moderate Sedation:      Moderate (conscious) sedation was personally administered by an       anesthesia professional. The following parameters were monitored: oxygen       saturation, heart rate, blood pressure, and response to care. Recommendation:           - Perform a colonoscopy today. Procedure Code(s):        --- Professional ---                           [phone],  Esophagogastroduodenoscopy, flexible,                            transoral; with biopsy, single or multiple Diagnosis Code(s):        --- Professional ---                           K21.00, Gastro-esophageal reflux disease with                            esophagitis, without bleeding                           K31.89, Other diseases of stomach and duodenum                           R12, Heartburn CPT copyright 2022 American Medical Association. All rights reserved. The codes documented in this report are preliminary and upon coder review may  be revised to meet current compliance requirements. Felecia Hopper, MD Felecia Hopper, MD 06/23/2023 8:17:44 AM Number of Addenda: 0

## 2023-06-23 NOTE — H&P (Signed)
 Primary Care Physician:  Pcp, No Primary Gastroenterologist:  Dr. Veronda Goody  Reason for Visit -outpatient EGD and colonoscopy  HPI: Brenda Mercado is a 43 y.o. female here for outpatient EGD and colonoscopy for anemia and change in bowel habit with some constipation.  She is having dark stool while taking iron supplements but denies seeing any blood or blood per rectum.  Denies any abdominal pain, nausea and vomiting.  Past Medical History:  Diagnosis Date   Anemia    Goiter    Migraines    Migraines    Obesity     Past Surgical History:  Procedure Laterality Date   MYOMECTOMY  2   ROBOT ASSISTED MYOMECTOMY N/A 12/17/2018   Procedure: XI ROBOTIC ASSISTED MYOMECTOMY, WITH HAND MORCELLATION;  Surgeon: Ward, Margarie Shay, MD;  Location: [facility] ORS;  Service: Gynecology;  Laterality: N/A;   TONSILLECTOMY  2004    Prior to Admission medications   Medication Sig Start Date End Date Taking? Authorizing Provider  cetirizine (ZYRTEC) 10 MG tablet Take 10 mg by mouth daily.   Yes [provider]  fluticasone (FLONASE) 50 MCG/ACT nasal spray Place 1 spray into both nostrils daily as needed for allergies.    Yes [provider]  ibuprofen  (ADVIL ) 800 MG tablet Take 1 tablet (800 mg total) by mouth every 6 (six) hours. 12/21/18  Yes Ward, Margarie Shay, MD  SUMAtriptan (IMITREX) 25 MG tablet Take 25 mg by mouth every 2 (two) hours as needed for migraine.    Yes [provider]  enoxaparin  (LOVENOX ) 40 MG/0.4ML injection Inject 0.4 mLs (40 mg total) into the skin daily for 14 days. 12/21/18 01/04/19  Ward, Margarie Shay, MD  hydroxypropyl methylcellulose / hypromellose (ISOPTO TEARS / GONIOVISC) 2.5 % ophthalmic solution Place 1 drop into both eyes 3 (three) times daily as needed for dry eyes.    [provider]  oxyCODONE  (OXY IR/ROXICODONE ) 5 MG immediate release tablet Take 1-2 tablets (5-10 mg total) by mouth every 4 (four) hours as needed for moderate pain.  12/21/18   Ward, Margarie Shay, MD    Scheduled Meds: Continuous Infusions:  sodium chloride  500 mL (06/23/23 0708)   PRN Meds:.sodium chloride   Allergies as of 04/13/2023   (No Known Allergies)    Family History  Problem Relation Age of Onset   Heart murmur Mother    Heart Problems Father    Hypertension Father    Diabetes Maternal Grandmother    Migraines Brother    Migraines Sister    Breast cancer Maternal Uncle        mat great uncle    Social History   Socioeconomic History   Marital status: Single    Spouse name: Not on file   Number of children: 0   Years of education: Masters   Highest education level: Not on file  Occupational History   Occupation: Child psychotherapist: [employer]  Tobacco Use   Smoking status: Never   Smokeless tobacco: Never  Vaping Use   Vaping status: Never Used  Substance and Sexual Activity   Alcohol use: Yes    Alcohol/week: 0.0 standard drinks of alcohol    Comment: occasionally   Drug use: No   Sexual activity: Not on file  Other Topics Concern   Not on file  Social History Narrative   Patient lives alone.   Patient is Right Handed.   Caffeine Use: 1 cup daily   Patient has a Master's Degree.  Social Drivers of Corporate investment banker Strain: Not on file  Food Insecurity: Not on file  Transportation Needs: Not on file  Physical Activity: Not on file  Stress: Not on file  Social Connections: Not on file  Intimate Partner Violence: Not on file    Review of Systems: All negative except as stated above in HPI.  Physical Exam: Vital signs: Vitals:   06/23/23 0649 06/23/23 0720  BP: (!) 197/107 (!) 164/84  Pulse: 93   Resp: 14   Temp: 98.8 F (37.1 C)   SpO2: 100%      General: Morbidly obese, not in acute distress Lungs: No visible respiratory distress Heart:  Regular rate and rhythm; no murmurs, clicks, rubs,  or gallops. Abdomen: Soft, nontender, nondistended, bowel sound present, no  peritoneal signs Mood and affect normal Alert and oriented x 3 Rectal:  Deferred  GI:  Lab Results: No results for input(s): WBC, HGB, HCT, PLT in the last 72 hours. BMET No results for input(s): NA, K, CL, CO2, GLUCOSE, BUN, CREATININE, CALCIUM in the last 72 hours. LFT No results for input(s): PROT, ALBUMIN , AST, ALT, ALKPHOS, BILITOT, BILIDIR, IBILI in the last 72 hours. PT/INR No results for input(s): LABPROT, INR in the last 72 hours.   Studies/Results: No results found.  Impression/Plan: -Iron deficiency anemia - Change in bowel habits with constipation - Morbid obesity with BMI of more than 50  Recommendations --------------------------- - Proceed with EGD and colonoscopy today.  Risks (bleeding, infection, bowel perforation that could require surgery, sedation-related changes in cardiopulmonary systems), benefits (identification and possible treatment of source of symptoms, exclusion of certain causes of symptoms), and alternatives (watchful waiting, radiographic imaging studies, empiric medical treatment)  were explained to patient/family in detail and patient wishes to proceed.     LOS: 0 days   Felecia Hopper  MD, FACP 06/23/2023, 7:32 AM  Contact #  [phone]

## 2023-06-23 NOTE — Transfer of Care (Signed)
 Immediate Anesthesia Transfer of Care Note  Patient: Brenda Mercado  Procedure(s) Performed: COLONOSCOPY EGD (ESOPHAGOGASTRODUODENOSCOPY)  Patient Location: PACU and Endoscopy Unit  Anesthesia Type:MAC  Level of Consciousness: oriented, drowsy, and patient cooperative  Airway & Oxygen Therapy: Patient Spontanous Breathing and Patient connected to face mask oxygen  Post-op Assessment: Report given to RN and Post -op Vital signs reviewed and stable  Post vital signs: Reviewed and stable  Last Vitals:  Vitals Value Taken Time  BP 154/80 06/23/23 08:17  Temp    Pulse 76 06/23/23 08:19  Resp 17 06/23/23 08:19  SpO2 100 % 06/23/23 08:19  Vitals shown include unfiled device data.  Last Pain:  Vitals:   06/23/23 0649  TempSrc: Temporal  PainSc: 0-No pain         Complications: No notable events documented.

## 2023-06-23 NOTE — Op Note (Signed)
 [facility] Patient Name: Brenda Mercado Procedure Date: 06/23/2023 MRN: [id number] Attending MD: Felecia Hopper , MD, [id number] Date of Birth: August 05, 1980 CSN: [id number] Age: 43 Admit Type: Outpatient Procedure:                Colonoscopy Indications:              This is the patient's first colonoscopy, Iron                            deficiency anemia Providers:                Felecia Hopper, MD, Golda Latch, RN, Rinda Cheers, Technician Referring MD:              Medicines:                Sedation Administered by an Anesthesia Professional Complications:            No immediate complications. Estimated Blood Loss:     Estimated blood loss was minimal. Procedure:                Pre-Anesthesia Assessment:                           - Prior to the procedure, a History and Physical                            was performed, and patient medications and                            allergies were reviewed. The patient's tolerance of                            previous anesthesia was also reviewed. The risks                            and benefits of the procedure and the sedation                            options and risks were discussed with the patient.                            All questions were answered, and informed consent                            was obtained. Prior Anticoagulants: The patient has                            taken no anticoagulant or antiplatelet agents. ASA                            Grade Assessment: IV - A patient with severe  systemic disease that is a constant threat to life.                            After reviewing the risks and benefits, the patient                            was deemed in satisfactory condition to undergo the                            procedure.                           - Prior to the procedure, a History and Physical                            was performed,  and patient medications and                            allergies were reviewed. The patient's tolerance of                            previous anesthesia was also reviewed. The risks                            and benefits of the procedure and the sedation                            options and risks were discussed with the patient.                            All questions were answered, and informed consent                            was obtained. Prior Anticoagulants: The patient has                            taken no anticoagulant or antiplatelet agents. ASA                            Grade Assessment: IV - A patient with severe                            systemic disease that is a constant threat to life.                            After reviewing the risks and benefits, the patient                            was deemed in satisfactory condition to undergo the                            procedure.  After obtaining informed consent, the colonoscope                            was passed under direct vision. Throughout the                            procedure, the patient's blood pressure, pulse, and                            oxygen saturations were monitored continuously. The                            PCF-HQ190L ([id number] Olympus colonoscope was                            introduced through the anus and advanced to the the                            terminal ileum, with identification of the                            appendiceal orifice and IC valve. The colonoscopy                            was performed without difficulty. The patient                            tolerated the procedure well. The quality of the                            bowel preparation was good. The terminal ileum,                            ileocecal valve, appendiceal orifice, and rectum                            were photographed. Scope In: 7:57:51 AM Scope Out: 8:10:38 AM Scope  Withdrawal Time: 0 hours 9 minutes 23 seconds  Total Procedure Duration: 0 hours 12 minutes 47 seconds  Findings:      Hemorrhoids were found on perianal exam.      The terminal ileum appeared normal.      Internal hemorrhoids were found during retroflexion. The hemorrhoids       were small. Impression:               - Hemorrhoids found on perianal exam.                           - The examined portion of the ileum was normal.                           - Internal hemorrhoids.                           - No specimens collected. Moderate Sedation:  Moderate (conscious) sedation was personally administered by an       anesthesia professional. The following parameters were monitored: oxygen       saturation, heart rate, blood pressure, and response to care. Recommendation:           - Patient has a contact number available for                            emergencies. The signs and symptoms of potential                            delayed complications were discussed with the                            patient. Return to normal activities tomorrow.                            Written discharge instructions were provided to the                            patient.                           - Resume previous diet.                           - Continue present medications.                           - Repeat colonoscopy in 10 years for screening                            purposes.                           - Return to my office in 3 months. Procedure Code(s):        --- Professional ---                           [id number], Colonoscopy, flexible; diagnostic, including                            collection of specimen(s) by brushing or washing,                            when performed (separate procedure) Diagnosis Code(s):        --- Professional ---                           K64.8, Other hemorrhoids                           D50.9, Iron deficiency anemia, unspecified CPT copyright 2022 American  Medical Association. All rights reserved. The codes documented in this report are preliminary and upon coder review may  be revised to meet current compliance requirements. Felecia Hopper, MD Felecia Hopper, MD 06/23/2023 8:23:08 AM Number of Addenda: 0

## 2023-06-23 NOTE — Anesthesia Preprocedure Evaluation (Addendum)
 Anesthesia Evaluation  Patient identified by MRN, date of birth, ID band Patient awake    Reviewed: Allergy & Precautions, NPO status , Patient's Chart, lab work & pertinent test results  Airway Mallampati: II  TM Distance: >3 FB Neck ROM: Full    Dental no notable dental hx.    Pulmonary neg pulmonary ROS   Pulmonary exam normal        Cardiovascular negative cardio ROS Normal cardiovascular exam     Neuro/Psych  Headaches  negative psych ROS   GI/Hepatic negative GI ROS, Neg liver ROS,,,  Endo/Other    Class 4 obesity  Renal/GU negative Renal ROS     Musculoskeletal negative musculoskeletal ROS (+)    Abdominal  (+) + obese  Peds  Hematology negative hematology ROS (+)   Anesthesia Other Findings Change in bowel habits   Anemia Iron Deficency  Reproductive/Obstetrics Hcg negative                             Anesthesia Physical Anesthesia Plan  ASA: 4  Anesthesia Plan: MAC   Post-op Pain Management:    Induction:   PONV Risk Score and Plan: 2 and Treatment may vary due to age or medical condition  Airway Management Planned: Nasal Cannula  Additional Equipment:   Intra-op Plan:   Post-operative Plan:   Informed Consent: I have reviewed the patients History and Physical, chart, labs and discussed the procedure including the risks, benefits and alternatives for the proposed anesthesia with the patient or authorized representative who has indicated his/her understanding and acceptance.     Dental advisory given  Plan Discussed with: CRNA  Anesthesia Plan Comments:        Anesthesia Quick Evaluation

## 2023-06-24 LAB — SURGICAL PATHOLOGY

## 2023-06-24 LAB — HM PAP SMEAR: HPV, high-risk: NEGATIVE

## 2023-06-24 NOTE — Anesthesia Postprocedure Evaluation (Signed)
 Anesthesia Post Note  Patient: Brenda Mercado  Procedure(s) Performed: COLONOSCOPY EGD (ESOPHAGOGASTRODUODENOSCOPY)     Patient location during evaluation: Endoscopy Anesthesia Type: MAC Level of consciousness: awake Pain management: pain level controlled Vital Signs Assessment: post-procedure vital signs reviewed and stable Respiratory status: spontaneous breathing, nonlabored ventilation and respiratory function stable Cardiovascular status: blood pressure returned to baseline and stable Postop Assessment: no apparent nausea or vomiting Anesthetic complications: no   No notable events documented.  Last Vitals:  Vitals:   06/23/23 0830 06/23/23 0840  BP: (!) 140/84 (!) 152/81  Pulse: 92 75  Resp: 20 15  Temp:    SpO2: 100% 100%    Last Pain:  Vitals:   06/23/23 0840  TempSrc:   PainSc: 0-No pain                 Needham Biggins P Kynisha Memon

## 2023-06-26 ENCOUNTER — Encounter (HOSPITAL_COMMUNITY): Payer: Self-pay | Admitting: Gastroenterology

## 2023-07-31 LAB — HM MAMMOGRAPHY

## 2023-08-03 ENCOUNTER — Other Ambulatory Visit: Payer: Self-pay | Admitting: Gastroenterology

## 2023-08-03 DIAGNOSIS — R194 Change in bowel habit: Secondary | ICD-10-CM

## 2023-08-03 DIAGNOSIS — R933 Abnormal findings on diagnostic imaging of other parts of digestive tract: Secondary | ICD-10-CM

## 2023-08-19 ENCOUNTER — Other Ambulatory Visit: Payer: Self-pay | Admitting: Internal Medicine

## 2023-08-19 ENCOUNTER — Ambulatory Visit
Admission: RE | Admit: 2023-08-19 | Discharge: 2023-08-19 | Disposition: A | Discharge status: Home / Self Care | Source: Ambulatory Visit | Attending: Internal Medicine | Admitting: Internal Medicine

## 2023-08-19 DIAGNOSIS — E042 Nontoxic multinodular goiter: Secondary | ICD-10-CM

## 2023-08-25 ENCOUNTER — Ambulatory Visit
Admission: RE | Admit: 2023-08-25 | Discharge: 2023-08-25 | Disposition: A | Discharge status: Home / Self Care | Source: Ambulatory Visit | Attending: Gastroenterology | Admitting: Gastroenterology

## 2023-08-25 DIAGNOSIS — R933 Abnormal findings on diagnostic imaging of other parts of digestive tract: Secondary | ICD-10-CM

## 2023-08-25 DIAGNOSIS — R194 Change in bowel habit: Secondary | ICD-10-CM

## 2023-08-25 MED ORDER — IOPAMIDOL (ISOVUE-300) INJECTION 61%
100.0000 mL | Freq: Once | INTRAVENOUS | Status: AC | PRN
  Administered 2023-08-25: 100 mL via INTRAVENOUS

## 2024-01-05 ENCOUNTER — Encounter: Payer: Self-pay | Admitting: Nurse Practitioner

## 2024-01-05 ENCOUNTER — Ambulatory Visit (INDEPENDENT_AMBULATORY_CARE_PROVIDER_SITE_OTHER): Admitting: Nurse Practitioner

## 2024-01-05 VITALS — BP 130/80 | HR 85 | Temp 98.6°F | Ht 64.0 in | Wt 341.2 lb

## 2024-01-05 DIAGNOSIS — Z136 Encounter for screening for cardiovascular disorders: Secondary | ICD-10-CM | POA: Diagnosis not present

## 2024-01-05 DIAGNOSIS — Z139 Encounter for screening, unspecified: Secondary | ICD-10-CM

## 2024-01-05 DIAGNOSIS — Z13220 Encounter for screening for lipoid disorders: Secondary | ICD-10-CM

## 2024-01-05 DIAGNOSIS — E0590 Thyrotoxicosis, unspecified without thyrotoxic crisis or storm: Secondary | ICD-10-CM | POA: Diagnosis not present

## 2024-01-05 DIAGNOSIS — Z7689 Persons encountering health services in other specified circumstances: Secondary | ICD-10-CM

## 2024-01-05 DIAGNOSIS — E559 Vitamin D deficiency, unspecified: Secondary | ICD-10-CM | POA: Diagnosis not present

## 2024-01-05 DIAGNOSIS — Z6841 Body Mass Index (BMI) 40.0 and over, adult: Secondary | ICD-10-CM

## 2024-01-05 DIAGNOSIS — D500 Iron deficiency anemia secondary to blood loss (chronic): Secondary | ICD-10-CM | POA: Diagnosis not present

## 2024-01-05 DIAGNOSIS — R5383 Other fatigue: Secondary | ICD-10-CM

## 2024-01-05 DIAGNOSIS — Z1159 Encounter for screening for other viral diseases: Secondary | ICD-10-CM

## 2024-01-05 NOTE — Assessment & Plan Note (Signed)
 Right-sided goiter. Last ultrasound February 2025 showed growth, not concerning. Thyroid  levels well-managed. - Continue annual monitoring of thyroid  function and goiter size.

## 2024-01-05 NOTE — Assessment & Plan Note (Signed)
 Iron deficiency anemia likely from chronic blood loss. Reports heavy menstrual bleeding. Taking iron supplements. Previous endoscopy and colonoscopy unremarkable, thin stomach lining possibly from NSAID use. - Continue iron supplementation. - Ordered labs to check iron levels.

## 2024-01-05 NOTE — Progress Notes (Signed)
 LILLETTE Kristeen JINNY Gladis, CMA,acting as a neurosurgeon for Gaines Ada, FNP.,have documented all relevant documentation on the behalf of Gaines Ada, FNP,as directed by  Gaines Ada, FNP while in the presence of Gaines Ada, FNP.  Subjective:  Patient ID: Brenda Mercado , female    DOB: 1980-05-11 , 43 y.o.   MRN: [id number]  Chief Complaint  Patient presents with   Establish Care    Patient presents today to establish care, Patient reports compliance with medication. Patient denies any chest pain, SOB, or headaches.    Obesity    Patient would like to discuss weight    Discussed the use of AI scribe software for clinical note transcription with the patient, who gave verbal consent to proceed.  History of Present Illness Brenda Mercado is a 43 year old female who presents for a new patient visit and evaluation of weight management.  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 She is no longer on Lovenox  and takes iron supplements once a day. She experiences low energy levels, possibly related to anemia, and maintains hydration by drinking water regularly while avoiding sugary drinks.  She experiences migraines typically triggered by weather changes or her menstrual cycle. These episodes are infrequent without specific triggers, and she does not currently use sumatriptan for management.  She has a goiter on the right side of her thyroid , first identified in 2017. She reports that her endocrinologist noted the goiter had grown since 2017, but her thyroid  levels were reported as normal during her follow-up in February of this year.  She underwent a colonoscopy and endoscopy due to concerns about internal bleeding, which were unremarkable except for a thin lining attributed to NSAID use. She takes ibuprofen  for pain management but is not currently on any other pain medications.  Her  current medications include eye drops for allergies, Flonase nasal spray, cetirizine, pantoprazole for reflux, and a Mirena IUD, which has regulated her menstrual cycle after insertion earlier this year.  She reports fluctuating blood pressure readings, with home measurements ranging from 127/72 to 140/80, using a wrist monitor. No consistent high blood pressure or issues with blood sugar levels.  She is concerned about her weight, noting a steady increase over the past few years. Her diet is inconsistent, often eating only once a day and frequently consuming fast food. She has tried Weight Watchers in the past with minimal success and has not used any medications for weight loss. She recently purchased a treadmill to increase her physical activity, as she currently averages about 2,869 steps per day.   She was referred by her OB/GYN. She is originally from [place]. 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 Single. No children.   History of migraines related to weather or menstrual. She now is regular with her menstrual cycle with her IUD. Her IUD is new since October. Goiter - had an ultrasound done in 2017. She has seen Dr. Gearl last seen in February 2025. She is due to see her again in February. She has had a colonoscopy and EGD done due to anemia.   Breakfast - breakfast sandwich Lunch - no lunch Dinner - chicken and cabbage Does snack Would like to see a dietian. She has tried weight watchers with point system - not great results  No medications for weight loss Always struggled with weight Sometimes exercises - barriers are being fatigued.  She drinks water all day, or seltzer. No sodas on daily.     Past Medical History:  Diagnosis Date   Allergy    Anemia    Blood transfusion without reported diagnosis    Clotting disorder    GERD (gastroesophageal reflux disease)    Goiter    Migraines    Migraines    Obesity      Family History  Problem Relation Age  of Onset   Heart murmur Mother    Diabetes Mother    Heart Problems Father    Hypertension Father    Diabetes Maternal Grandmother    Migraines Brother    Migraines Sister    Breast cancer Maternal Uncle        mat great uncle    Current Medications[1]   Allergies[2]   Review of Systems  Constitutional: Negative.  Negative for appetite change and fatigue.  Respiratory: Negative.    Cardiovascular: Negative.   Endocrine: Negative for cold intolerance and heat intolerance.  Skin: Negative.   Neurological: Negative.  Negative for dizziness, light-headedness and headaches.  Psychiatric/Behavioral:  Negative for behavioral problems and dysphoric mood. The patient is not nervous/anxious.      Today's Vitals   01/05/24 0846  BP: 130/80  Pulse: 85  Temp: 98.6 F (37 C)  TempSrc: Oral  Weight: (!) 341 lb 3.2 oz (154.8 kg)  Height: 5' 4 (1.626 m)  PainSc: 3   PainLoc: Knee   Body mass index is 58.57 kg/m.  Wt Readings from Last 3 Encounters:  01/05/24 (!) 341 lb 3.2 oz (154.8 kg)  06/23/23 (!) 330 lb (149.7 kg)  12/21/18 (!) 314 lb 9.5 oz (142.7 kg)     Objective:  Physical Exam Vitals and nursing note reviewed.  Constitutional:      General: She is not in acute distress.    Appearance: Normal appearance. She is well-developed. She is obese.  Cardiovascular:     Rate and Rhythm: Normal rate and regular rhythm.     Pulses: Normal pulses.     Heart sounds: Normal heart sounds. No murmur heard. Pulmonary:     Effort: Pulmonary effort is normal. No respiratory distress.     Breath sounds: Normal breath sounds.  Chest:     Chest wall: No tenderness.  Musculoskeletal:        General: Normal range of motion.  Skin:    General: Skin is warm and dry.     Capillary Refill: Capillary refill takes less than 2 seconds.  Neurological:     General: No focal deficit present.     Mental Status: She is alert and oriented to person, place, and time.     Cranial Nerves: No  cranial nerve deficit.  Psychiatric:        Mood and Affect: Mood normal.        Behavior: Behavior normal.        Thought Content: Thought content normal.        Judgment: Judgment normal.      Assessment And Plan:   Assessment & Plan Establishing care with new doctor, encounter for Patient is here to establish care. Went over patient medical, family, social and surgical history. Reviewed with patient their medications and any allergies  Reviewed with patient their sexual orientation, drug/tobacco and alcohol use Dicussed any new concerns with patient  recommended patient comes in for a physical exam and complete blood work.  Educated patient about the importance of annual screenings and immunizations.  Advised patient to eat a healthy diet along with exercise for atleast 30-45 min atleast 4-5 days of the week.  Vitamin D  deficiency Will check vitamin D  level and supplement as needed.    Also encouraged to spend 15 minutes in the sun daily.   Subclinical hyperthyroidism Right-sided goiter. Last ultrasound February 2025 showed growth, not concerning. Thyroid  levels well-managed. - Continue annual monitoring of thyroid  function and goiter size. Morbid obesity with BMI of 50.0-59.9, adult (HCC) BMI 58.7. Weight increasing. Desires weight under 300 lbs, ideally 250 lbs. Irregular meals, preference for fried foods, limited activity due to fatigue. No prior weight loss medications. Emphasized diet and exercise importance, 80% diet-related. Discussed dietitian referral, weight management program, and weight loss medications requiring lifestyle changes. - Encouraged low-calorie diet, reduced carbohydrates and refined sugars. - Recommended increasing physical activity to 5,000 steps/day, aiming for 10,000. - Suggested resistance band exercises twice weekly. - Discussed dietitian or weight management program referral. - Ordered labs for prediabetes or diabetes. - Provided information on  intermittent fasting and dietary resources. Encounter for screening  Encounter for hepatitis C screening test for low risk patient  Iron deficiency anemia due to chronic blood loss Iron deficiency anemia likely from chronic blood loss. Reports heavy menstrual bleeding. Taking iron supplements. Previous endoscopy and colonoscopy unremarkable, thin stomach lining possibly from NSAID use. - Continue iron supplementation. - Ordered labs to check iron levels. Encounter for lipid screening for cardiovascular disease  Other fatigue Will check for metabolic causes  Discussed regular blood pressure monitoring. Home readings vary, some above 130/80 mmHg. Emphasized accurate measurement with upper arm cuff. Discussed lifestyle modifications if elevated. - Recommended purchasing upper arm blood pressure cuff. - Advised monitoring blood pressure four times a week, recording readings. - Discussed lifestyle modifications including diet and exercise for blood pressure management. Orders Placed This Encounter  Procedures   Hepatitis B Surface Antibody   Hepatitis C antibody   VITAMIN D  25 Hydroxy (Vit-D Deficiency, Fractures)   TSH + free T4   Iron, TIBC and Ferritin Panel   CBC with Differential/Platelet   Hemoglobin A1c      Return in about 4 months (around 05/05/2024) for phy when able.   Patient was given opportunity to ask questions. Patient verbalized understanding of the plan and was able to repeat key elements of the plan. All questions were answered to their satisfaction.    LILLETTE Gaines Ada, FNP, have reviewed all documentation for this visit. The documentation on 01/05/2024 for the exam, diagnosis, procedures, and orders are all accurate and complete.   IF YOU HAVE BEEN REFERRED TO A SPECIALIST, IT MAY TAKE 1-2 WEEKS TO SCHEDULE/PROCESS THE REFERRAL. IF YOU HAVE NOT HEARD FROM US /SPECIALIST IN TWO WEEKS, PLEASE GIVE US  A CALL AT [phone].      [1]  Current Outpatient  Medications:    cetirizine (ZYRTEC) 10 MG tablet, Take 10 mg by mouth daily., Disp: , Rfl:    fluticasone (FLONASE) 50 MCG/ACT nasal spray, Place 1 spray into both nostrils daily as needed for allergies. , Disp: , Rfl:    hydroxypropyl methylcellulose / hypromellose (ISOPTO TEARS / GONIOVISC) 2.5 % ophthalmic solution, Place 1 drop into both eyes 3 (three) times daily as needed for dry eyes., Disp: , Rfl:    levonorgestrel (MIRENA, 52 MG,) 20 MCG/DAY IUD, 1 each by Intrauterine route once., Disp: , Rfl:    pantoprazole (PROTONIX) 40 MG tablet, Take 40 mg by mouth daily., Disp: , Rfl:  [2]  No Known Allergies

## 2024-01-05 NOTE — Assessment & Plan Note (Signed)
 Will check vitamin D  level and supplement as needed.    Also encouraged to spend 15 minutes in the sun daily.

## 2024-01-05 NOTE — Patient Instructions (Addendum)
 Work on weight loss. Daily exercise at least 45 minutes per day, eat off the small plate to help limit portions, try to eat more vegetables and fruits and fewer fried foods and simple carbs like white bread, and white potatoes. Try to limit sugar intake to 26 grams per day- its a challenge! Avoid sugar sweetened beverages like sweet tea and soda. Try to limit alcohol intake since its a lot of calories. Try to lose a pound a week. You can do this! Try reading Mindless Eating by Dr. Redell Sos. We all overeat for a variety of reasons and he targets those reasons and give practical advice on how to get around ourselves. Also read Always Hungry by Dr. Alm League who is a diabetes specialist who explains why sugar consumption makes weight loss so hard and gives recipes to get around this.   For any break at work, try the 7 minute work out. If you have 15 minutes, do it twice! Here is a link to it: http://well.blogs.nytimes.com/2011/05/15/the-scientific-7-minute-workout. There is also an app you can get for your phone.   This link is for Dr. Bronson beginners intermittent [email or web address]

## 2024-01-06 LAB — CBC WITH DIFFERENTIAL/PLATELET
Basophils Absolute: 0 x10E3/uL (ref 0.0–0.2)
Basos: 1 %
EOS (ABSOLUTE): 0.1 x10E3/uL (ref 0.0–0.4)
Eos: 2 %
Hematocrit: 39.8 % (ref 34.0–46.6)
Hemoglobin: 11.4 g/dL (ref 11.1–15.9)
Immature Grans (Abs): 0 x10E3/uL (ref 0.0–0.1)
Immature Granulocytes: 0 %
Lymphocytes Absolute: 1.2 x10E3/uL (ref 0.7–3.1)
Lymphs: 37 %
MCH: 20.1 pg — ABNORMAL LOW (ref 26.6–33.0)
MCHC: 28.6 g/dL — ABNORMAL LOW (ref 31.5–35.7)
MCV: 70 fL — ABNORMAL LOW (ref 79–97)
Monocytes Absolute: 0.3 x10E3/uL (ref 0.1–0.9)
Monocytes: 9 %
Neutrophils Absolute: 1.7 x10E3/uL (ref 1.4–7.0)
Neutrophils: 51 %
Platelets: 206 x10E3/uL (ref 150–450)
RBC: 5.66 x10E6/uL — ABNORMAL HIGH (ref 3.77–5.28)
RDW: 19.8 % — ABNORMAL HIGH (ref 11.7–15.4)
WBC: 3.3 x10E3/uL — ABNORMAL LOW (ref 3.4–10.8)

## 2024-01-06 LAB — LIPID PANEL
Chol/HDL Ratio: 3.8 ratio (ref 0.0–4.4)
Cholesterol, Total: 207 mg/dL — ABNORMAL HIGH (ref 100–199)
HDL: 54 mg/dL
LDL Chol Calc (NIH): 123 mg/dL — ABNORMAL HIGH (ref 0–99)
Triglycerides: 173 mg/dL — ABNORMAL HIGH (ref 0–149)
VLDL Cholesterol Cal: 30 mg/dL (ref 5–40)

## 2024-01-06 LAB — IRON,TIBC AND FERRITIN PANEL
Ferritin: 24 ng/mL (ref 15–150)
Iron Saturation: 10 % — ABNORMAL LOW (ref 15–55)
Iron: 37 ug/dL (ref 27–159)
Total Iron Binding Capacity: 366 ug/dL (ref 250–450)
UIBC: 329 ug/dL (ref 131–425)

## 2024-01-06 LAB — VITAMIN D 25 HYDROXY (VIT D DEFICIENCY, FRACTURES): Vit D, 25-Hydroxy: 36.7 ng/mL (ref 30.0–100.0)

## 2024-01-06 LAB — TSH+FREE T4
Free T4: 1.75 ng/dL (ref 0.82–1.77)
TSH: 0.495 u[IU]/mL (ref 0.450–4.500)

## 2024-01-06 LAB — HEPATITIS C ANTIBODY: Hep C Virus Ab: NONREACTIVE

## 2024-01-06 LAB — HEPATITIS B SURFACE ANTIBODY,QUALITATIVE: Hep B Surface Ab, Qual: NONREACTIVE

## 2024-01-06 LAB — HEMOGLOBIN A1C
Est. average glucose Bld gHb Est-mCnc: 114 mg/dL
Hgb A1c MFr Bld: 5.6 % (ref 4.8–5.6)

## 2024-01-06 LAB — VITAMIN B12: Vitamin B-12: 474 pg/mL (ref 232–1245)

## 2024-02-22 ENCOUNTER — Ambulatory Visit: Payer: Self-pay | Admitting: Nurse Practitioner

## 2024-04-19 ENCOUNTER — Encounter: Payer: Self-pay | Admitting: Nurse Practitioner
